# Patient Record
Sex: Female | Born: 1945 | Race: White | Hispanic: Yes | Marital: Married | Smoking: Never smoker
Health system: Southern US, Community
[De-identification: ages and names within clinical notes are randomized; demographics above are authoritative.]

## PROBLEM LIST (undated history)

## (undated) DIAGNOSIS — F32A Depression, unspecified: Secondary | ICD-10-CM

## (undated) DIAGNOSIS — E039 Hypothyroidism, unspecified: Secondary | ICD-10-CM

## (undated) DIAGNOSIS — F329 Major depressive disorder, single episode, unspecified: Secondary | ICD-10-CM

## (undated) DIAGNOSIS — G629 Polyneuropathy, unspecified: Secondary | ICD-10-CM

## (undated) DIAGNOSIS — I10 Essential (primary) hypertension: Secondary | ICD-10-CM

## (undated) HISTORY — PX: BUNIONECTOMY: SHX129

---

## 2009-09-25 DIAGNOSIS — E039 Hypothyroidism, unspecified: Secondary | ICD-10-CM | POA: Insufficient documentation

## 2009-09-25 DIAGNOSIS — I10 Essential (primary) hypertension: Secondary | ICD-10-CM | POA: Insufficient documentation

## 2011-11-18 DIAGNOSIS — F329 Major depressive disorder, single episode, unspecified: Secondary | ICD-10-CM | POA: Insufficient documentation

## 2012-01-01 DIAGNOSIS — M722 Plantar fascial fibromatosis: Secondary | ICD-10-CM | POA: Insufficient documentation

## 2012-01-11 DIAGNOSIS — R350 Frequency of micturition: Secondary | ICD-10-CM | POA: Insufficient documentation

## 2012-01-11 DIAGNOSIS — N816 Rectocele: Secondary | ICD-10-CM | POA: Insufficient documentation

## 2012-01-11 DIAGNOSIS — N8111 Cystocele, midline: Secondary | ICD-10-CM | POA: Insufficient documentation

## 2012-07-19 DIAGNOSIS — N3946 Mixed incontinence: Secondary | ICD-10-CM | POA: Insufficient documentation

## 2012-07-19 DIAGNOSIS — M79609 Pain in unspecified limb: Secondary | ICD-10-CM | POA: Insufficient documentation

## 2012-07-19 DIAGNOSIS — M436 Torticollis: Secondary | ICD-10-CM | POA: Insufficient documentation

## 2012-07-19 DIAGNOSIS — M19019 Primary osteoarthritis, unspecified shoulder: Secondary | ICD-10-CM | POA: Insufficient documentation

## 2013-05-12 ENCOUNTER — Other Ambulatory Visit: Payer: Self-pay | Admitting: Urology

## 2013-06-07 ENCOUNTER — Other Ambulatory Visit: Payer: Self-pay | Admitting: Urology

## 2013-06-27 ENCOUNTER — Other Ambulatory Visit: Payer: Self-pay | Admitting: Obstetrics and Gynecology

## 2013-06-28 DIAGNOSIS — Z5320 Procedure and treatment not carried out because of patient's decision for unspecified reasons: Secondary | ICD-10-CM | POA: Insufficient documentation

## 2013-07-03 ENCOUNTER — Encounter (HOSPITAL_COMMUNITY): Payer: Self-pay | Admitting: Pharmacist

## 2013-07-04 ENCOUNTER — Encounter (HOSPITAL_COMMUNITY)
Admission: RE | Admit: 2013-07-04 | Discharge: 2013-07-04 | Disposition: A | Discharge status: Home / Self Care | Payer: Federal, State, Local not specified - PPO | Source: Ambulatory Visit | Attending: Obstetrics and Gynecology | Admitting: Obstetrics and Gynecology

## 2013-07-04 ENCOUNTER — Encounter (HOSPITAL_COMMUNITY): Payer: Self-pay

## 2013-07-04 DIAGNOSIS — Z01810 Encounter for preprocedural cardiovascular examination: Secondary | ICD-10-CM | POA: Insufficient documentation

## 2013-07-04 DIAGNOSIS — Z01812 Encounter for preprocedural laboratory examination: Secondary | ICD-10-CM | POA: Insufficient documentation

## 2013-07-04 HISTORY — DX: Polyneuropathy, unspecified: G62.9

## 2013-07-04 HISTORY — DX: Essential (primary) hypertension: I10

## 2013-07-04 HISTORY — DX: Hypothyroidism, unspecified: E03.9

## 2013-07-04 HISTORY — DX: Depression, unspecified: F32.A

## 2013-07-04 HISTORY — DX: Major depressive disorder, single episode, unspecified: F32.9

## 2013-07-04 LAB — CBC
HEMATOCRIT: 39.6 % (ref 36.0–46.0)
HEMOGLOBIN: 13.1 g/dL (ref 12.0–15.0)
MCH: 30.5 pg (ref 26.0–34.0)
MCHC: 33.1 g/dL (ref 30.0–36.0)
MCV: 92.1 fL (ref 78.0–100.0)
Platelets: 359 10*3/uL (ref 150–400)
RBC: 4.3 MIL/uL (ref 3.87–5.11)
RDW: 13.5 % (ref 11.5–15.5)
WBC: 6.9 10*3/uL (ref 4.0–10.5)

## 2013-07-04 LAB — BASIC METABOLIC PANEL
BUN: 15 mg/dL (ref 6–23)
CO2: 28 meq/L (ref 19–32)
Calcium: 9.8 mg/dL (ref 8.4–10.5)
Chloride: 97 mEq/L (ref 96–112)
Creatinine, Ser: 0.64 mg/dL (ref 0.50–1.10)
GFR calc Af Amer: >90 mL/min (ref >90)
GFR, EST NON AFRICAN AMERICAN: 90 mL/min — AB (ref >90)
GLUCOSE: 90 mg/dL (ref 70–99)
Potassium: 3.5 mEq/L — ABNORMAL LOW (ref 3.7–5.3)
Sodium: 136 mEq/L — ABNORMAL LOW (ref 137–147)

## 2013-07-04 LAB — PROTIME-INR
INR: 0.96 (ref 0.00–1.49)
PROTHROMBIN TIME: 12.6 s (ref 11.6–15.2)

## 2013-07-04 LAB — APTT: APTT: 32 s (ref 24–37)

## 2013-07-04 NOTE — Patient Instructions (Signed)
[place]  07/04/2013   Your procedure is scheduled on:  07/12/13  Enter through the Main Entrance of [facility] [place] up the phone at the desk and dial [phone].   Call this number if you have problems the morning of surgery: [phone]   Remember:   Do not eat food:After Midnight.  Do not drink clear liquids: After Midnight.  Take these medicines the morning of surgery with A SIP OF WATER: blood pressure medication, may take Prozac, may take thyroid medication   Do not wear jewelry, make-up or nail polish.  Do not wear lotions, powders, or perfumes. You may wear deodorant.  Do not shave 48 hours prior to surgery.  Do not bring valuables to the hospital.  [facility] is not   responsible for any belongings or valuables brought to the hospital.  Contacts, dentures or bridgework may not be worn into surgery.  Leave suitcase in the car. After surgery it may be brought to your room.  For patients admitted to the hospital, checkout time is 11:00 AM the day of              discharge.   Patients discharged the day of surgery will not be allowed to drive             home.  Name and phone number of your driver: NA  Special Instructions:      Please read over the following fact sheets that you were given:   Surgical Site Infection Prevention

## 2013-07-11 MED ORDER — GENTAMICIN SULFATE 40 MG/ML IJ SOLN
5.0000 mg/kg | Freq: Once | INTRAVENOUS | Status: DC
  Filled 2013-07-11: qty 9

## 2013-07-11 MED ORDER — PHENAZOPYRIDINE HCL 200 MG PO TABS
200.0000 mg | ORAL_TABLET | ORAL | Status: AC
  Administered 2013-07-12: 200 mg via ORAL
  Filled 2013-07-11: qty 1

## 2013-07-11 MED ORDER — GENTAMICIN SULFATE 40 MG/ML IJ SOLN
5.0000 mg/kg | Freq: Once | INTRAVENOUS | Status: AC
  Administered 2013-07-12: 359.5 mg via INTRAVENOUS
  Filled 2013-07-11: qty 9

## 2013-07-11 NOTE — H&P (Signed)
History of Present Illness   [name] has vaginal bulging sensation. She has failed I believe 3 trials with a pessary. She does bowel splinting. Sometimes her bowel movements are like pellets and sometimes she will sometime use a laxative. She has not had a hysterectomy. She has urge incontinence and gets up 3 times a night. She was assessed at [facility]   On pelvic examination she had a grade 3 cystocele that exited the introitus associated with a central defect. Cervix descended from 8 or 9 cm to approximately 4-5 cm. She had a distal grade 2 rectocele. She had a negative cough test with hypermobility of the bladder neck. Residual was 80 mL. I drew her a picture last time and I thought if she ever had surgery, she likely benefit from a transvaginal hysterectomy with vault suspension, cystocele repair and graft, and almost for certain a rectocele repair. I recommended a renal ultrasound to rule out silent hydronephrosis and she has never had cystoscopy.   Today she had no blood in the urine nor has she on other 3 occasions.   On January 8, she had a renal ultrasound. Right kidney was 11.37 cm x 5.36 cm x 4.59 cm. Left kidney was 10.77 cm x 5.08 cm x 5.37 cm. There was 18 mL in her bladder. There was no hydronephrosis. Renal cortex looked healthy. She was here to discuss her urodynamics. Review of Systems: No change in bowel or neurologic systems.   On urodynamics, [name] did not void and was catheterized for 40 mL. Maximum capacity was 787 mL. She may have had some low pressure unstable contractions reaching pressures of 5 cmH2O, but she did not leak. She did not leak with a Valsalva pressure of 125 cmH2O with and without the prolapse reduced. During voluntary voiding, she voided 456 mL with a maximum flow of 15 mL/sec. Maximum voiding pressure was 35-43 cmH2O. Residual was 330 mL. Her contraction was not that well sustained and had a little bit of a stepping pattern as noted. EMG activity  increased during the voiding phase. Bladder neck descended 3 cm. She had no reflux. She had a very impressive cystocele fluoroscopically. The details of the urodynamics are signed and dictated on the urodynamic sheet.    Past Medical History Problems  1. History of arthritis (V13.4) 2. History of depression (V11.8) 3. History of hepatitis (V12.09) 4. History of hypertension (V12.59) 5. History of hypothyroidism (V12.29)  Surgical History Problems  1. History of Cesarean Section 2. History of Foot Surgery 3. History of Simple Bunion Exostectomy (Silver Procedure)  Current Meds 1. AmLODIPine Besylate TABS;  Therapy: (Recorded:08Dec2014) to Recorded 2. Bayer Aspirin TABS;  Therapy: (Recorded:08Dec2014) to Recorded 3. Enalapril Maleate TABS;  Therapy: (Recorded:08Dec2014) to Recorded 4. Gabapentin TABS;  Therapy: (Recorded:08Dec2014) to Recorded 5. Levothyroxine Sodium TABS;  Therapy: (Recorded:08Dec2014) to Recorded 6. PROzac TABS;  Therapy: (Recorded:08Dec2014) to Recorded 7. Vyvanse CAPS;  Therapy: (Recorded:08Dec2014) to Recorded  Allergies Medication  1. No Known Drug Allergies  Family History Problems  1. Family history of diabetes mellitus (V18.0) : Grandmother 2. Family history of glaucoma (V19.11) : Sister 3. Family history of retinitis pigmentosa (V19.19) : Grandfather, Aunt, Uncle  Social History Problems  1. Alcohol use   occassional 2. Caffeine use (V49.89)   2 cups of tea (maybe) per day 3. Death in the family, father   age 70 due to heart attack 4. Married 5. Non-smoker (V49.89) 6. Retired 34. Three children   2 sons  and 1 daughter  Results/Data  Urine [Data Includes: Last 1 Day]   [id number]  COLOR YELLOW   APPEARANCE CLEAR   SPECIFIC GRAVITY 1.025   pH 7.5   GLUCOSE NEG mg/dL  BILIRUBIN NEG   KETONE NEG mg/dL  BLOOD NEG   PROTEIN NEG mg/dL  UROBILINOGEN 0.2 mg/dL  NITRITE NEG   LEUKOCYTE ESTERASE NEG    Assessment Assessed  1.  Cystocele, midline (618.01) 2. Urge incontinence of urine (788.31)  Discussion/Summary   I drew [name] a picture. We talked about her urge incontinence and medical and behavioral therapy. The pad she wears each day is more for the bulge than for the incontinence.   I talked to her about watchful waiting versus pessary versus prolapse surgery.  We talked about a transvaginal hysterectomy, vault suspension, cystocele repair and graft and probable rectocele repair.   I drew her a picture and we talked about prolapse surgery in detail. Pros, cons, general surgical and anesthetic risks, and other options including behavioral therapy, pessaries, and watchful waiting were discussed. She understands that prolapse repairs are successful in 80-85% of cases for prolapse symptoms and can recur anteriorly, posteriorly, and/or apically. She understands that in most cases I use a graft and general risks were discussed. Surgical risks were described but not limited to the discussion of injury to neighboring structures including the bowel (with possible life-threatening sepsis and colostomy), bladder, urethra, vagina (all resulting in further surgery), and ureter (resulting in re-implantation). We talked about injury to nerves/soft tissue leading to debilitating and intractable pelvic, abdominal, and lower extremity pain syndromes and neuropathies. The risks of buttock pain, intractable dyspareunia, and vaginal narrowing and shortening with sequelae were discussed. Bleeding risks, transfusion rates, and infection were discussed. The risk of persistent, de novo, or worsening bladder and/or bowel incontinence/dysfunction was discussed. The need for CIC was described as well the usual post-operative course. The patient understands that she might not reach her treatment goal and that she might be worse following surgery.  Mesh issues on TV discussed.   De novo incontinence discussed.   [name] will see Dr.  Garwin Brothers in approximately 2 months when she gets back from looking after her mother. I am going to send a copy of my notes. She is not sexually active. She understands bowel dysfunction is not effected by the surgery, no is urge incontinence.   After a thorough review of the management options for the patient's condition the patient  elected to proceed with surgical therapy as noted above. We have discussed the potential benefits and risks of the procedure, side effects of the proposed treatment, the likelihood of the patient achieving the goals of the procedure, and any potential problems that might occur during the procedure or recuperation. Informed consent has been obtained.

## 2013-07-12 ENCOUNTER — Encounter (HOSPITAL_COMMUNITY)
Admission: RE | Disposition: A | Discharge status: Home / Self Care | Payer: Self-pay | Source: Ambulatory Visit | Attending: Obstetrics and Gynecology

## 2013-07-12 ENCOUNTER — Encounter (HOSPITAL_COMMUNITY): Payer: Self-pay | Admitting: Anesthesiology

## 2013-07-12 ENCOUNTER — Inpatient Hospital Stay (HOSPITAL_COMMUNITY)
Admission: RE | Admit: 2013-07-12 | Discharge: 2013-07-13 | DRG: 743 | Disposition: A | Discharge status: Home / Self Care | Payer: Medicare Other | Source: Ambulatory Visit | Attending: Obstetrics and Gynecology | Admitting: Obstetrics and Gynecology

## 2013-07-12 ENCOUNTER — Encounter (HOSPITAL_COMMUNITY): Payer: Medicare Other | Admitting: Anesthesiology

## 2013-07-12 ENCOUNTER — Ambulatory Visit (HOSPITAL_COMMUNITY): Payer: Medicare Other | Admitting: Anesthesiology

## 2013-07-12 DIAGNOSIS — N3941 Urge incontinence: Secondary | ICD-10-CM | POA: Diagnosis present

## 2013-07-12 DIAGNOSIS — N816 Rectocele: Secondary | ICD-10-CM

## 2013-07-12 DIAGNOSIS — N812 Incomplete uterovaginal prolapse: Principal | ICD-10-CM | POA: Diagnosis present

## 2013-07-12 DIAGNOSIS — R339 Retention of urine, unspecified: Secondary | ICD-10-CM | POA: Diagnosis not present

## 2013-07-12 DIAGNOSIS — IMO0002 Reserved for concepts with insufficient information to code with codable children: Secondary | ICD-10-CM

## 2013-07-12 DIAGNOSIS — F411 Generalized anxiety disorder: Secondary | ICD-10-CM | POA: Diagnosis present

## 2013-07-12 DIAGNOSIS — Z90710 Acquired absence of both cervix and uterus: Secondary | ICD-10-CM | POA: Diagnosis present

## 2013-07-12 DIAGNOSIS — E039 Hypothyroidism, unspecified: Secondary | ICD-10-CM | POA: Diagnosis present

## 2013-07-12 DIAGNOSIS — I10 Essential (primary) hypertension: Secondary | ICD-10-CM | POA: Diagnosis present

## 2013-07-12 DIAGNOSIS — D250 Submucous leiomyoma of uterus: Secondary | ICD-10-CM | POA: Diagnosis present

## 2013-07-12 HISTORY — PX: VAGINAL PROLAPSE REPAIR: SHX830

## 2013-07-12 HISTORY — PX: VAGINAL HYSTERECTOMY: SHX2639

## 2013-07-12 HISTORY — PX: CYSTO: SHX6284

## 2013-07-12 HISTORY — PX: ANTERIOR AND POSTERIOR REPAIR: SHX5121

## 2013-07-12 LAB — TYPE AND SCREEN
ABO/RH(D): O POS
Antibody Screen: NEGATIVE

## 2013-07-12 LAB — ABO/RH: ABO/RH(D): O POS

## 2013-07-12 SURGERY — HYSTERECTOMY, VAGINAL
Anesthesia: General | Site: Vagina

## 2013-07-12 MED ORDER — SODIUM CHLORIDE 0.9 % IR SOLN: Status: DC | PRN

## 2013-07-12 MED ORDER — METHYLENE BLUE 1 % INJ SOLN
INTRAMUSCULAR | Status: AC
  Filled 2013-07-12: qty 10

## 2013-07-12 MED ORDER — MIDAZOLAM HCL 2 MG/2ML IJ SOLN
INTRAMUSCULAR | Status: AC
  Filled 2013-07-12: qty 2

## 2013-07-12 MED ORDER — IBUPROFEN 800 MG PO TABS
800.0000 mg | ORAL_TABLET | Freq: Three times a day (TID) | ORAL | Status: DC | PRN
  Administered 2013-07-13 (×2): 800 mg via ORAL
  Filled 2013-07-12 (×3): qty 1

## 2013-07-12 MED ORDER — NEOSTIGMINE METHYLSULFATE 10 MG/10ML IV SOLN
INTRAVENOUS | Status: AC
  Filled 2013-07-12: qty 1

## 2013-07-12 MED ORDER — LIDOCAINE HCL (CARDIAC) 20 MG/ML IV SOLN
INTRAVENOUS | Status: DC | PRN
  Administered 2013-07-12: 60 mg via INTRAVENOUS

## 2013-07-12 MED ORDER — GLYCOPYRROLATE 0.2 MG/ML IJ SOLN
INTRAMUSCULAR | Status: AC
  Filled 2013-07-12: qty 6

## 2013-07-12 MED ORDER — GABAPENTIN 300 MG PO CAPS
300.0000 mg | ORAL_CAPSULE | Freq: Every day | ORAL | Status: DC
  Administered 2013-07-12: 300 mg via ORAL
  Filled 2013-07-12 (×2): qty 1

## 2013-07-12 MED ORDER — OXYCODONE-ACETAMINOPHEN 5-325 MG PO TABS
1.0000 | ORAL_TABLET | ORAL | Status: DC | PRN
  Administered 2013-07-13: 1 via ORAL
  Filled 2013-07-12: qty 1

## 2013-07-12 MED ORDER — ESTRADIOL 0.1 MG/GM VA CREA
TOPICAL_CREAM | VAGINAL | Status: DC | PRN
  Administered 2013-07-12: 1 via VAGINAL

## 2013-07-12 MED ORDER — FENTANYL CITRATE 0.05 MG/ML IJ SOLN
INTRAMUSCULAR | Status: DC | PRN
  Administered 2013-07-12 (×5): 50 ug via INTRAVENOUS
  Administered 2013-07-12: 100 ug via INTRAVENOUS

## 2013-07-12 MED ORDER — PANTOPRAZOLE SODIUM 40 MG PO TBEC
40.0000 mg | DELAYED_RELEASE_TABLET | Freq: Every day | ORAL | Status: DC
  Administered 2013-07-12 – 2013-07-13 (×2): 40 mg via ORAL
  Filled 2013-07-12 (×3): qty 1

## 2013-07-12 MED ORDER — PROPOFOL INFUSION 10 MG/ML OPTIME
INTRAVENOUS | Status: DC | PRN
  Administered 2013-07-12: 150 mL via INTRAVENOUS

## 2013-07-12 MED ORDER — ENALAPRIL MALEATE 5 MG PO TABS
5.0000 mg | ORAL_TABLET | Freq: Every day | ORAL | Status: DC
  Administered 2013-07-13: 5 mg via ORAL
  Filled 2013-07-12 (×3): qty 1

## 2013-07-12 MED ORDER — STERILE WATER FOR IRRIGATION IR SOLN
Status: DC | PRN
  Administered 2013-07-12: 1000 mL via INTRAVESICAL

## 2013-07-12 MED ORDER — HYDROMORPHONE HCL PF 1 MG/ML IJ SOLN
INTRAMUSCULAR | Status: AC
  Filled 2013-07-12: qty 1

## 2013-07-12 MED ORDER — ROCURONIUM BROMIDE 100 MG/10ML IV SOLN
INTRAVENOUS | Status: DC | PRN
  Administered 2013-07-12 (×3): 10 mg via INTRAVENOUS
  Administered 2013-07-12: 40 mg via INTRAVENOUS
  Administered 2013-07-12: 10 mg via INTRAVENOUS

## 2013-07-12 MED ORDER — NEOSTIGMINE METHYLSULFATE 10 MG/10ML IV SOLN
INTRAVENOUS | Status: DC | PRN
  Administered 2013-07-12: 4 mg via INTRAVENOUS

## 2013-07-12 MED ORDER — HYDROMORPHONE HCL PF 1 MG/ML IJ SOLN: 0.2000 mg | INTRAMUSCULAR | Status: DC | PRN

## 2013-07-12 MED ORDER — DEXAMETHASONE SODIUM PHOSPHATE 4 MG/ML IJ SOLN
INTRAMUSCULAR | Status: DC | PRN
  Administered 2013-07-12: 10 mg via INTRAVENOUS

## 2013-07-12 MED ORDER — AMLODIPINE BESYLATE 10 MG PO TABS
10.0000 mg | ORAL_TABLET | Freq: Every day | ORAL | Status: DC
  Administered 2013-07-13: 10 mg via ORAL
  Filled 2013-07-12 (×3): qty 1

## 2013-07-12 MED ORDER — GLYCOPYRROLATE 0.2 MG/ML IJ SOLN
INTRAMUSCULAR | Status: DC | PRN
  Administered 2013-07-12: .8 mg via INTRAVENOUS
  Administered 2013-07-12: 0.2 mg via INTRAVENOUS

## 2013-07-12 MED ORDER — FENTANYL CITRATE 0.05 MG/ML IJ SOLN
INTRAMUSCULAR | Status: AC
  Filled 2013-07-12: qty 2

## 2013-07-12 MED ORDER — KETOROLAC TROMETHAMINE 30 MG/ML IJ SOLN
30.0000 mg | Freq: Four times a day (QID) | INTRAMUSCULAR | Status: DC
  Administered 2013-07-12: 30 mg via INTRAVENOUS
  Filled 2013-07-12: qty 1

## 2013-07-12 MED ORDER — MENTHOL 3 MG MT LOZG
1.0000 | LOZENGE | OROMUCOSAL | Status: DC | PRN
  Administered 2013-07-12: 3 mg via ORAL
  Filled 2013-07-12: qty 9

## 2013-07-12 MED ORDER — DEXTROSE IN LACTATED RINGERS 5 % IV SOLN
INTRAVENOUS | Status: DC
  Administered 2013-07-12 – 2013-07-13 (×3): via INTRAVENOUS

## 2013-07-12 MED ORDER — MEPERIDINE HCL 25 MG/ML IJ SOLN
6.2500 mg | INTRAMUSCULAR | Status: DC | PRN
Start: 2013-07-12 — End: 2013-07-12

## 2013-07-12 MED ORDER — ROCURONIUM BROMIDE 100 MG/10ML IV SOLN
INTRAVENOUS | Status: AC
  Filled 2013-07-12: qty 1

## 2013-07-12 MED ORDER — LIDOCAINE HCL (CARDIAC) 20 MG/ML IV SOLN
INTRAVENOUS | Status: AC
  Filled 2013-07-12: qty 5

## 2013-07-12 MED ORDER — MIDAZOLAM HCL 5 MG/5ML IJ SOLN
INTRAMUSCULAR | Status: DC | PRN
  Administered 2013-07-12 (×2): 1 mg via INTRAVENOUS

## 2013-07-12 MED ORDER — FLUOXETINE HCL 20 MG PO CAPS
80.0000 mg | ORAL_CAPSULE | Freq: Every day | ORAL | Status: DC
  Administered 2013-07-13: 80 mg via ORAL
  Filled 2013-07-12 (×3): qty 4

## 2013-07-12 MED ORDER — ONDANSETRON HCL 4 MG PO TABS: 4.0000 mg | ORAL_TABLET | Freq: Four times a day (QID) | ORAL | Status: DC | PRN

## 2013-07-12 MED ORDER — ESTRADIOL 0.1 MG/GM VA CREA
TOPICAL_CREAM | VAGINAL | Status: AC
  Filled 2013-07-12: qty 42.5

## 2013-07-12 MED ORDER — PROPOFOL 10 MG/ML IV EMUL
INTRAVENOUS | Status: AC
  Filled 2013-07-12: qty 20

## 2013-07-12 MED ORDER — 0.9 % SODIUM CHLORIDE (POUR BTL) OPTIME
TOPICAL | Status: DC | PRN
  Administered 2013-07-12: 1000 mL

## 2013-07-12 MED ORDER — CEFAZOLIN SODIUM-DEXTROSE 2-3 GM-% IV SOLR
2.0000 g | INTRAVENOUS | Status: AC
  Administered 2013-07-12: 2 g via INTRAVENOUS

## 2013-07-12 MED ORDER — CEFAZOLIN SODIUM-DEXTROSE 2-3 GM-% IV SOLR
INTRAVENOUS | Status: AC
  Filled 2013-07-12: qty 50

## 2013-07-12 MED ORDER — HYDROMORPHONE HCL PF 1 MG/ML IJ SOLN
INTRAMUSCULAR | Status: DC | PRN
  Administered 2013-07-12: 1 mg via INTRAVENOUS

## 2013-07-12 MED ORDER — MIDAZOLAM HCL 2 MG/2ML IJ SOLN: 0.5000 mg | Freq: Once | INTRAMUSCULAR | Status: DC | PRN

## 2013-07-12 MED ORDER — TRIAMTERENE-HCTZ 37.5-25 MG PO CAPS
1.0000 | ORAL_CAPSULE | Freq: Every day | ORAL | Status: DC
  Administered 2013-07-13: 1 via ORAL
  Filled 2013-07-12 (×3): qty 1

## 2013-07-12 MED ORDER — BUPROPION HCL ER (XL) 150 MG PO TB24
150.0000 mg | ORAL_TABLET | Freq: Every day | ORAL | Status: DC
  Administered 2013-07-13: 150 mg via ORAL
  Filled 2013-07-12 (×3): qty 1

## 2013-07-12 MED ORDER — LACTATED RINGERS IV SOLN
INTRAVENOUS | Status: DC
Start: 2013-07-12 — End: 2013-07-12
  Administered 2013-07-12 (×2): via INTRAVENOUS

## 2013-07-12 MED ORDER — LEVOTHYROXINE SODIUM 137 MCG PO TABS
137.0000 ug | ORAL_TABLET | Freq: Every day | ORAL | Status: DC
  Administered 2013-07-13: 137 ug via ORAL
  Filled 2013-07-12 (×2): qty 1

## 2013-07-12 MED ORDER — FENTANYL CITRATE 0.05 MG/ML IJ SOLN
INTRAMUSCULAR | Status: AC
  Filled 2013-07-12: qty 5

## 2013-07-12 MED ORDER — DEXAMETHASONE SODIUM PHOSPHATE 10 MG/ML IJ SOLN
INTRAMUSCULAR | Status: AC
  Filled 2013-07-12: qty 1

## 2013-07-12 MED ORDER — KETOROLAC TROMETHAMINE 30 MG/ML IJ SOLN
15.0000 mg | Freq: Once | INTRAMUSCULAR | Status: DC | PRN
Start: 2013-07-12 — End: 2013-07-12

## 2013-07-12 MED ORDER — ONDANSETRON HCL 4 MG/2ML IJ SOLN: 4.0000 mg | Freq: Four times a day (QID) | INTRAMUSCULAR | Status: DC | PRN

## 2013-07-12 MED ORDER — PROMETHAZINE HCL 25 MG/ML IJ SOLN
6.2500 mg | INTRAMUSCULAR | Status: DC | PRN
Start: 2013-07-12 — End: 2013-07-12

## 2013-07-12 MED ORDER — KETOROLAC TROMETHAMINE 30 MG/ML IJ SOLN: 30.0000 mg | Freq: Four times a day (QID) | INTRAMUSCULAR | Status: DC

## 2013-07-12 MED ORDER — METHYLPHENIDATE HCL ER 10 MG PO TBCR: 27.0000 mg | EXTENDED_RELEASE_TABLET | ORAL | Status: DC

## 2013-07-12 MED ORDER — LIDOCAINE-EPINEPHRINE (PF) 1 %-1:200000 IJ SOLN
INTRAMUSCULAR | Status: DC | PRN
Start: 2013-07-12 — End: 2013-07-12
  Administered 2013-07-12: 50 mL

## 2013-07-12 MED ORDER — SODIUM CHLORIDE 0.9 % IR SOLN
Freq: Once | Status: AC
  Administered 2013-07-12: 09:00:00
  Filled 2013-07-12: qty 1

## 2013-07-12 MED ORDER — FLUOXETINE HCL 40 MG PO CAPS: 80.0000 mg | ORAL_CAPSULE | Freq: Every day | ORAL | Status: DC

## 2013-07-12 MED ORDER — ONDANSETRON HCL 4 MG/2ML IJ SOLN
INTRAMUSCULAR | Status: AC
  Filled 2013-07-12: qty 2

## 2013-07-12 MED ORDER — FENTANYL CITRATE 0.05 MG/ML IJ SOLN: 25.0000 ug | INTRAMUSCULAR | Status: DC | PRN

## 2013-07-12 SURGICAL SUPPLY — 62 items
BLADE 15 SAFETY STRL DISP (BLADE) ×4 IMPLANT
CANISTER SUCT 3000ML (MISCELLANEOUS) ×8 IMPLANT
CATH FOLEY 2WAY SLVR  5CC 16FR (CATHETERS)
CATH FOLEY 2WAY SLVR 5CC 16FR (CATHETERS) IMPLANT
CATH ROBINSON RED A/P 16FR (CATHETERS) IMPLANT
CLOTH BEACON ORANGE TIMEOUT ST (SAFETY) ×4 IMPLANT
CONT PATH 16OZ SNAP LID 3702 (MISCELLANEOUS) IMPLANT
CONTAINER PREFILL 10% NBF 60ML (FORM) ×4 IMPLANT
DECANTER SPIKE VIAL GLASS SM (MISCELLANEOUS) ×8 IMPLANT
DERMABOND ADVANCED (GAUZE/BANDAGES/DRESSINGS)
DERMABOND ADVANCED .7 DNX12 (GAUZE/BANDAGES/DRESSINGS) IMPLANT
DEVICE CAPIO SLIM SINGLE (INSTRUMENTS) IMPLANT
DRAIN PENROSE 1/4X12 LTX (DRAIN) ×4 IMPLANT
DRAPE HYSTEROSCOPY (DRAPE) ×4 IMPLANT
DRAPE STERI URO 9X17 APER PCH (DRAPES) ×4 IMPLANT
DRAPE UNDERBUTTOCKS STRL (DRAPE) ×4 IMPLANT
ELECT LIGASURE SHORT 9 REUSE (ELECTRODE) ×4 IMPLANT
GAUZE PACKING 2X5 YD STRL (GAUZE/BANDAGES/DRESSINGS) IMPLANT
GAUZE PACKING IODOFORM 2 (PACKING) ×4 IMPLANT
GAUZE SPONGE 4X4 16PLY XRAY LF (GAUZE/BANDAGES/DRESSINGS) ×8 IMPLANT
GLOVE BIO SURGEON STRL SZ7.5 (GLOVE) ×4 IMPLANT
GLOVE BIO SURGEON STRL SZ8 (GLOVE) ×8 IMPLANT
GLOVE BIOGEL PI IND STRL 6.5 (GLOVE) ×2 IMPLANT
GLOVE BIOGEL PI IND STRL 7.0 (GLOVE) ×2 IMPLANT
GLOVE BIOGEL PI INDICATOR 6.5 (GLOVE) ×2
GLOVE BIOGEL PI INDICATOR 7.0 (GLOVE) ×2
GLOVE ECLIPSE 6.5 STRL STRAW (GLOVE) ×4 IMPLANT
GOWN STRL REUS W/TWL LRG LVL3 (GOWN DISPOSABLE) ×32 IMPLANT
NEEDLE HYPO 22GX1.5 SAFETY (NEEDLE) ×4 IMPLANT
NEEDLE MAYO 6 CRC TAPER PT (NEEDLE) IMPLANT
NEEDLE SPNL 22GX3.5 QUINCKE BK (NEEDLE) IMPLANT
NS IRRIG 1000ML POUR BTL (IV SOLUTION) ×8 IMPLANT
PACK VAGINAL WOMENS (CUSTOM PROCEDURE TRAY) ×4 IMPLANT
PAD OB MATERNITY 4.3X12.25 (PERSONAL CARE ITEMS) ×4 IMPLANT
PENCIL BUTTON HOLSTER BLD 10FT (ELECTRODE) ×4 IMPLANT
PLUG CATH AND CAP STER (CATHETERS) ×4 IMPLANT
RETRACTOR STAY HOOK 5MM (MISCELLANEOUS) ×4 IMPLANT
SET CYSTO W/LG BORE CLAMP LF (SET/KITS/TRAYS/PACK) ×4 IMPLANT
SHEET LAVH (DRAPES) ×8 IMPLANT
SUT CAPIO ETHIBPND (SUTURE) IMPLANT
SUT SILK 2 0 FS (SUTURE) IMPLANT
SUT VIC AB 0 CT1 18XCR BRD8 (SUTURE) ×2 IMPLANT
SUT VIC AB 0 CT1 27 (SUTURE) ×6
SUT VIC AB 0 CT1 27XBRD ANBCTR (SUTURE) ×4 IMPLANT
SUT VIC AB 0 CT1 27XCR 8 STRN (SUTURE) ×2 IMPLANT
SUT VIC AB 0 CT1 8-18 (SUTURE) ×2
SUT VIC AB 0 CT2 27 (SUTURE) IMPLANT
SUT VIC AB 2-0 CT1 (SUTURE) ×8 IMPLANT
SUT VIC AB 2-0 SH 27 (SUTURE) ×12
SUT VIC AB 2-0 SH 27XBRD (SUTURE) ×12 IMPLANT
SUT VIC AB 3-0 CT1 27 (SUTURE) ×2
SUT VIC AB 3-0 CT1 TAPERPNT 27 (SUTURE) ×2 IMPLANT
SUT VIC AB 3-0 PS2 18 (SUTURE)
SUT VIC AB 3-0 PS2 18XBRD (SUTURE) IMPLANT
SUT VICRYL 0 TIES 12 18 (SUTURE) ×4 IMPLANT
SUT VICRYL 0 UR6 27IN ABS (SUTURE) ×8 IMPLANT
TISSUE REPAIR XENFORM 6X10CM (Tissue) ×4 IMPLANT
TOWEL OR 17X24 6PK STRL BLUE (TOWEL DISPOSABLE) ×16 IMPLANT
TRAY FOLEY CATH 14FR (SET/KITS/TRAYS/PACK) ×8 IMPLANT
TUBING NON-CON 1/4 X 20 CONN (TUBING) ×3 IMPLANT
TUBING NON-CON 1/4 X 20' CONN (TUBING) ×1
WATER STERILE IRR 1000ML POUR (IV SOLUTION) ×8 IMPLANT

## 2013-07-12 NOTE — Anesthesia Postprocedure Evaluation (Signed)
  Anesthesia Post Note  Patient: Alexis Lang  Procedure(s) Performed: Procedure(s) (LRB): HYSTERECTOMY VAGINAL  (N/A) ANTERIOR (CYSTOCELE) AND POSTERIOR REPAIR (RECTOCELE) (N/A) CYSTO (N/A) VAGINAL VAULT SUSPENSION WITH GRAFT (N/A)  Anesthesia type: GA  Patient location: PACU  Post pain: Pain level controlled  Post assessment: Post-op Vital signs reviewed  Last Vitals:  Filed Vitals:   07/12/13 1215  BP: 102/45  Pulse: 66  Temp:   Resp: 22    Post vital signs: Reviewed  Level of consciousness: sedated  Complications: No apparent anesthesia complications

## 2013-07-12 NOTE — Transfer of Care (Signed)
Immediate Anesthesia Transfer of Care Note  Patient: Alexis Lang  Procedure(s) Performed: Procedure(s): HYSTERECTOMY VAGINAL  (N/A) ANTERIOR (CYSTOCELE) AND POSTERIOR REPAIR (RECTOCELE) (N/A) CYSTO (N/A) VAGINAL VAULT SUSPENSION WITH GRAFT (N/A)  Patient Location: PACU  Anesthesia Type:General  Level of Consciousness: awake and sedated  Airway & Oxygen Therapy: Patient Spontanous Breathing and Patient connected to nasal cannula oxygen  Post-op Assessment: Report given to PACU RN and Post -op Vital signs reviewed and stable  Post vital signs: Reviewed and stable  Complications: No apparent anesthesia complications

## 2013-07-12 NOTE — Op Note (Signed)
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 Nicki Reaper Jad Johansson Asst. Dr. Cheron Schaumann  The patient has the above diagnoses and consented to the above procedure. Extra care was taken with leg positioning to minimize the risk of compartment syndrome and neuropathy and deep vein thrombosis. I assisted Dr. Garwin Brothers who performed a transvaginal hysterectomy. She ran the posterior cuff. Ureteral sacral ligaments were not strong. Cuff was left open at the beginning of my surgery  I instilled 20 cc of a lidocaine epinephrine mixture for her grade 3 cystocele with central defect. Utilizing multiple Allis clamps I made a long anterior vaginal wall incision to the proximal urethra or bladder neck. I sharply and bluntly dissected the overlying vaginal wall mucosa from the underlying pubocervical fascia to the white line bilaterally. I mobilized appropriate at the vault.  I did an anterior repair with 2-0 Vicryl 2 layer repair. I was careful to keep good anterior length on the bladder. I was pleased with the anterior repair and I did not imbricate the urethra.   The patient underwent cystoscopy. Cystoscopically she had a good repair. She was no distortion of the ureters. She had excellent yellow jets bilaterally  I prepared a 10 x 6 dermal graft the shape of a trapezoid. The graft was soaked as per protocol  I finger dissected to the ischial spine bilaterally. She had very large spines. All soft tissue was swept medially. She had a good sacrospinous ligament bilaterally. I placed a 0 Ethibond with a Capio device 1 full finger breath medial to the each spine in a straight line between the spines. I triple checked the position and is very pleased with it. I did a thorough rectal examination there was no injury to  rectum or suture in the rectum  I Utilized my usual technique and placed a 2-0 Vicryl suture on a UR 6 needle into the pelvic sidewall at the level of the urethrovesical angle. I sewed the graft in tension-free between the 4 sutures.  I trimmed an appropriate amount of anterior vaginal wall mucosa and closed the anterior vaginal wall with running 2-0 Vicryl on a CT1 needle  Dr. cousins closed the cuff and did a culdoplasty.  I then did a rectal examination and she diffuse weakness with very good length posteriorly.  I placed an Allis clamp on the hymenal ring at the level the posterior fourchette and removed a small triangle of perineal skin. I instilled 20 cc of a lidocaine epinephrine mixture. I made an appropriate long posterior vaginal wall incision all the way close to the cuff. I sharply and bluntly dissected the overlying vaginal wall mucosa from the underlying rectovaginal fascia. I mobilized to the sidewall appropriately. I repeated the rectal examination and she had diffuse weakness with no obvious site defect  I did a 2 layer posterior repair with running 2-0 Vicryl suture and was happy at the cuff how I did the repair. Repeat rectal examination was excellent with no injury.  I trimmed a few millimeters of posterior vaginal wall mucosa from and closed the posterior vaginal wall with running 2-0 Vicryl on a CT1 needle. The suture was exteriorized closing the perineum subcuticularly. Prior to this I did 1 gentle 0 Vicryl perineal body closure.  The patient had excellent length anterior and posteriorly. Cuff was well supported. Blood loss was less than 100 mL. Vaginal  pack with Estrace cream was utilized. Leg position was excellent. Urine was clear with reasonable output throughout the case  Hopefully the operation will reach the patient's treatment goal

## 2013-07-12 NOTE — Progress Notes (Signed)
Looks Banker normal Spoke with pt See in am No nerve pain

## 2013-07-12 NOTE — Brief Op Note (Signed)
07/12/2013  11:10 AM  PATIENT:  Alexis Lang  68 y.o. 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  POST-OPERATIVE DIAGNOSIS:  Uterovaginal prolapse,cystocele,rectocele  PROCEDURE:  Total vaginal hysterectomy, mcCall culdoplasty  SURGEON:  Surgeon(s) and Role: Panel 1:    * Amalee Olsen Clint Bolder, MD - Primary  Panel 2:    * Reece Packer, MD - Primary  PHYSICIAN ASSISTANT:   ASSISTANTS: Dr Nicki Reaper macDiarmid   ANESTHESIA:   general  EBL:  Total I/O In: 2000 [I.V.:2000] Out: 300 [Urine:200; Blood:100]  BLOOD ADMINISTERED:none  DRAINS: none   LOCAL MEDICATIONS USED:  LIDOCAINE   SPECIMEN:  Source of Specimen:  uterus w/ cervix  DISPOSITION OF SPECIMEN:  PATHOLOGY  COUNTS:  YES  TOURNIQUET:  * No tourniquets in log *  DICTATION: .Other Dictation: Dictation Number [id number]  PLAN OF CARE: Admit to inpatient   PATIENT DISPOSITION:  PACU - hemodynamically stable.   Delay start of Pharmacological VTE agent (>24hrs) due to surgical blood loss or risk of bleeding: no

## 2013-07-12 NOTE — Anesthesia Preprocedure Evaluation (Signed)
Anesthesia Evaluation  Patient identified by MRN, date of birth, ID band Patient awake    Reviewed: Allergy & Precautions, H&P , Patient's Chart, lab work & pertinent test results, reviewed documented beta blocker date and time   History of Anesthesia Complications Negative for: history of anesthetic complications  Airway Mallampati: II TM Distance: >3 FB Neck ROM: full    Dental   Pulmonary  breath sounds clear to auscultation        Cardiovascular Exercise Tolerance: Good hypertension, Rhythm:regular Rate:Normal     Neuro/Psych    GI/Hepatic   Endo/Other  Hypothyroidism   Renal/GU      Musculoskeletal   Abdominal   Peds  Hematology   Anesthesia Other Findings   Reproductive/Obstetrics                           Anesthesia Physical Anesthesia Plan  ASA: II  Anesthesia Plan: General ETT   Post-op Pain Management:    Induction:   Airway Management Planned:   Additional Equipment:   Intra-op Plan:   Post-operative Plan:   Informed Consent: I have reviewed the patients History and Physical, chart, labs and discussed the procedure including the risks, benefits and alternatives for the proposed anesthesia with the patient or authorized representative who has indicated his/her understanding and acceptance.   Dental Advisory Given  Plan Discussed with: CRNA and Surgeon  Anesthesia Plan Comments:         Anesthesia Quick Evaluation

## 2013-07-13 ENCOUNTER — Encounter (HOSPITAL_COMMUNITY): Payer: Self-pay | Admitting: Obstetrics and Gynecology

## 2013-07-13 LAB — BASIC METABOLIC PANEL
BUN: 11 mg/dL (ref 6–23)
CHLORIDE: 102 meq/L (ref 96–112)
CO2: 28 mEq/L (ref 19–32)
Calcium: 8.5 mg/dL (ref 8.4–10.5)
Creatinine, Ser: 0.54 mg/dL (ref 0.50–1.10)
GFR calc non Af Amer: >90 mL/min (ref >90)
Glucose, Bld: 136 mg/dL — ABNORMAL HIGH (ref 70–99)
POTASSIUM: 4.1 meq/L (ref 3.7–5.3)
Sodium: 139 mEq/L (ref 137–147)

## 2013-07-13 LAB — CBC
HEMATOCRIT: 32 % — AB (ref 36.0–46.0)
Hemoglobin: 10.5 g/dL — ABNORMAL LOW (ref 12.0–15.0)
MCH: 30.5 pg (ref 26.0–34.0)
MCHC: 32.8 g/dL (ref 30.0–36.0)
MCV: 93 fL (ref 78.0–100.0)
Platelets: 288 10*3/uL (ref 150–400)
RBC: 3.44 MIL/uL — ABNORMAL LOW (ref 3.87–5.11)
RDW: 13.7 % (ref 11.5–15.5)
WBC: 10.3 10*3/uL (ref 4.0–10.5)

## 2013-07-13 MED ORDER — OXYCODONE-ACETAMINOPHEN 5-325 MG PO TABS: 1.0000 | ORAL_TABLET | ORAL | Status: DC | PRN

## 2013-07-13 MED ORDER — IBUPROFEN 800 MG PO TABS: 800.0000 mg | ORAL_TABLET | Freq: Three times a day (TID) | ORAL | Status: AC | PRN

## 2013-07-13 NOTE — Addendum Note (Signed)
Addendum created 07/13/13 0746 by Talbot Grumbling, CRNA   Modules edited: Notes Section   Notes Section:  File: [id number]

## 2013-07-13 NOTE — Progress Notes (Signed)
Looks good Labs normal Vitals normal Discussed post op course  Void trial

## 2013-07-13 NOTE — Discharge Summary (Signed)
Physician Discharge Summary  Patient ID: Alexis Lang MRN: [id number] DOB/AGE: [birth year] 68 y.o.  Admit date: 07/12/2013 Discharge date: 07/13/2013  Admission Diagnoses: uterovaginal prolapse, cystocele, rectocele, vault prolapse  Discharge Diagnoses: uterovaginal prolapse, cystocele, rectocele, vault prolapse, urinary retention Active Problems:   S/P vaginal hysterectomy rectocele and cystocele repair, vault suspension with graft, cystoscopy Hypertension Anxiety disorder hypothyroidism  Discharged Condition: stable  Hospital Course: Pt was admitted to [facility] where she underwent TVH, cystoscopy, rectocele and cystocele repair, vault suspension with graft. See operative reports. Postoperative course notable for urinary retention requiring discharge with leg bag  Consults: None  Significant Diagnostic Studies: labs:  CBC Latest Ref Rng 07/13/2013 07/04/2013  WBC 4.0 - 10.5 K/uL 10.3 6.9  Hemoglobin 12.0 - 15.0 g/dL 10.5(L) 13.1  Hematocrit 36.0 - 46.0 % 32.0(L) 39.6  Platelets 150 - 400 K/uL 288 359      Treatments: surgery: TVH, cystoscopy, rectocele and cystocele repair, vault suspension with graft  Discharge Exam: Blood pressure 106/52, pulse 56, temperature 97.5 F (36.4 C), temperature source Oral, resp. rate 18, height 5\' 6"  (1.676 m), weight 90.719 kg (200 lb), SpO2 98.00%. General appearance: alert, cooperative and no distress Breasts: normal appearance, no masses or tenderness, Normal to palpation without dominant masses Cardio: regular rate and rhythm, S1, S2 normal, no murmur, click, rub or gallop Pelvic: deferred Extremities: no edema, redness or tenderness in the calves or thighs Skin: Skin color, texture, turgor normal. No rashes or lesions Pad scant brown blood  Disposition: Final discharge disposition not confirmed  Discharge Instructions   Diet - low sodium heart healthy    Complete by:  As directed      Discharge instructions    Complete by:  As directed    Call if temperature greater than equal to 100.4, nothing per vagina for 4-6 weeks or severe nausea vomiting, increased incisional pain , drainage or redness in the incision site, no straining with bowel movements, showers no bath     Discharge patient    Complete by:  As directed   Pending voiding trial instructions from dr Matilde Sprang     Increase activity slowly    Complete by:  As directed      No wound care    Complete by:  As directed             Medication List    STOP taking these medications       aspirin EC 325 MG tablet      TAKE these medications       amLODipine 10 MG tablet  Commonly known as:  NORVASC  Take 10 mg by mouth daily.     ARNICARE ARNICA Crea  Apply 1 application topically daily as needed (rhematic pain).     buPROPion 150 MG 24 hr tablet  Commonly known as:  WELLBUTRIN XL  Take 150 mg by mouth daily.     CALADRYL CLEAR 1-0.1 % Lotn  Generic drug:  pramoxine-zinc acetate  Apply 1 application topically as needed.     enalapril 5 MG tablet  Commonly known as:  VASOTEC  Take 5 mg by mouth daily.     FLUoxetine 40 MG capsule  Commonly known as:  PROZAC  Take 80 mg by mouth daily.     gabapentin 300 MG capsule  Commonly known as:  NEURONTIN  Take 300 mg by mouth at bedtime.     ibuprofen 800 MG tablet  Commonly known as:  ADVIL,MOTRIN  Take 1 tablet (800  mg total) by mouth every 8 (eight) hours as needed (mild pain).     levothyroxine 137 MCG tablet  Commonly known as:  SYNTHROID, LEVOTHROID  Take 137 mcg by mouth daily before breakfast.     loperamide 2 MG capsule  Commonly known as:  IMODIUM  Take 2-4 mg by mouth as needed for diarrhea or loose stools.     loratadine 10 MG tablet  Commonly known as:  CLARITIN  Take 10 mg by mouth daily as needed for allergies.     methylphenidate 27 MG CR tablet  Commonly known as:  CONCERTA  Take 27 mg by mouth every morning. Prescribed by Dr. Toy Care     omega-3 acid ethyl esters 1 G capsule   Commonly known as:  LOVAZA  Take 1 g by mouth 2 (two) times daily. Melaleuca Brand     oxyCODONE-acetaminophen 5-325 MG per tablet  Commonly known as:  PERCOCET/ROXICET  Take 1-2 tablets by mouth every 4 (four) hours as needed for severe pain (moderate to severe pain (when tolerating fluids)).     triamterene-hydrochlorothiazide 37.5-25 MG per capsule  Commonly known as:  DYAZIDE  Take 1 capsule by mouth daily.           Follow-up Information   Follow up with Narcissus Detwiler A, MD On 08/08/2013.   Specialty:  Obstetrics and Gynecology   Contact information:   [address] [place] [address] [phone]       Signed: Alanda Slim A Abhijot Straughter 07/13/2013, 8:14 AM

## 2013-07-13 NOTE — Progress Notes (Signed)
Pt. Is discharged in the care of [place] R.N. Escort. Denies any pain or discomfort. Discharged instructions with Rx were given to pt with good understanding Pt   Discharge with #14 foley catheter as ordered by Dr,. Pt residuals were very high. Leg bag was also sent home with pt. Instructions were given on foley care, with good understanding.

## 2013-07-13 NOTE — Anesthesia Postprocedure Evaluation (Signed)
Anesthesia Post Note  Patient: Alexis Lang  Procedure(s) Performed: Procedure(s) (LRB): HYSTERECTOMY VAGINAL  (N/A) ANTERIOR (CYSTOCELE) AND POSTERIOR REPAIR (RECTOCELE) (N/A) CYSTO (N/A) VAGINAL VAULT SUSPENSION WITH GRAFT (N/A)  Anesthesia type: General  Patient location: Mother/Baby  Post pain: Pain level controlled  Post assessment: Post-op Vital signs reviewed  Last Vitals:  Filed Vitals:   07/13/13 0538  BP: 106/52  Pulse: 56  Temp: 36.4 C  Resp:     Post vital signs: Reviewed  Level of consciousness: awake and alert   Complications: No apparent anesthesia complications

## 2013-07-13 NOTE — Progress Notes (Signed)
Subjective: Patient reports incisional pain.  Pain well controlled with med. No nausea. Eating breakfast Notes some low back pain  Objective: I have reviewed patient's vital signs.  vital signs, intake and output, medications and labs. Filed Vitals:   07/13/13 0538  BP: 106/52  Pulse: 56  Temp: 97.5 F (36.4 C)  Resp:    I/O last 3 completed shifts: In: 3050 [P.O.:850; I.V.:2200] Out: 2775 [Urine:2675; Blood:100]    Lab Results  Component Value Date   WBC 10.3 07/13/2013   HGB 10.5* 07/13/2013   HCT 32.0* 07/13/2013   MCV 93.0 07/13/2013   PLT 288 07/13/2013   Lab Results  Component Value Date   CREATININE 0.54 07/13/2013    EXAM General: alert, cooperative and no distress Resp: clear to auscultation bilaterally Cardio: regular rate and rhythm, S1, S2 normal, no murmur, click, rub or gallop GI: soft, non-tender; bowel sounds normal; no masses,  no organomegaly Extremities: no edema, redness or tenderness in the calves or thighs Vaginal Bleeding: minimal  Assessment: s/p Procedure(s): HYSTERECTOMY VAGINAL  ANTERIOR (CYSTOCELE) AND POSTERIOR REPAIR (RECTOCELE) CYSTO VAGINAL VAULT SUSPENSION WITH GRAFT: stable, progressing well and tolerating diet  Plan: Advance diet Encourage ambulation Advance to PO medication Discontinue IV fluids Discharge home pending voiding trial D/c instructions reviewed.  F/u 6/23  LOS: 1 day    Marvene Staff, MD 07/13/2013 8:06 AM    07/13/2013, 8:06 AM

## 2013-07-13 NOTE — Op Note (Signed)
NAME:  Alexis Lang, Alexis Lang               ACCOUNT NO.:  [id number]  MEDICAL RECORD NO.:  [id number]  LOCATION:  6283                          FACILITY:  [facility]  PHYSICIAN:  Servando Salina, M.D.DATE OF BIRTH:  [birth year]  DATE OF PROCEDURE:  07/12/2013 DATE OF DISCHARGE:                              OPERATIVE REPORT   PREOPERATIVE DIAGNOSIS:  Second-degree uterovaginal prolapse, cystocele, rectocele, vault prolapse.  POSTOPERATIVE DIAGNOSIS:  Second-degree uterovaginal prolapse, cystocele, rectocele.  PROCEDURE:  Total vaginal hysterectomy, McCall culdoplasty.  SURGEON:  Servando Salina, M.D.  ASSISTANT:  Dr. Bjorn Loser.  ESTIMATED BLOOD LOSS:  100 mL.  INTRAOPERATIVE FLUID:  2 L.  URINE OUTPUT:  200 mL urine.  COMPLICATIONS:  None.  DESCRIPTION OF PROCEDURE:  Under adequate general anesthesia, the patient was positioned in the dorsal lithotomy position.  The patient was confirmed by Dr. Vikki Ports.  Examination under anesthesia revealed a small uterus anteverted, no adnexal masses could be appreciated.  The cervix was at the level of the introitus.  The patient was sterilely prepped and draped in usual fashion.  An indwelling Foley catheter was sterilely placed.  Weighted speculum was placed in the vagina.  Sims retractor was placed anteriorly.  The cervix was grasped with Yates Decamp clamps and the cervical vaginal junction was identified.  Dilute solution of 1% lidocaine with 1:200,000 epinephrine was injected at the cervical vaginal junction.  Circumferential incision was then made at that junction and with sharp dissection, the posterior cul-de-sac was subsequently opened and the incision extended transversely.  The vaginal cuff, at that point, was oversewn with running lock stitch of 0 Vicryl suture.  The uterosacral ligaments was bilaterally clamped, cut, and suture ligated with 0 Vicryl suture.  Attention was then turned anteriorly where sharp dissection was  performed.  The patient had a prior history of cesarean section.  An anticipated scar tissue was taken into consideration as the dissection was done.  The dissection was carefully continued until the anterior cul-de-sac was reached and opened transversely.  Once this was done, Sims retractor was then replaced with displacing the bladder anteriorly.  At that point, using the LigaSure, the cardinal ligaments bilaterally were clamped, cauterized, and then cut, followed by the uterine vessels bilaterally clamped, cauterized, and then cut, carried up to the utero-ovarian ligaments bilaterally which were serially clamped, cauterized, and then ultimately cut.  Prior to cutting the left side of the utero-ovarian ligament, attempt grasping the left fallopian tube, the proximal portion was able to grasp, but the distal portion could not be seen, nor could ovary be noted or palpated or seen on that left side.  On the opposite side, once the left utero- ovarian ligament had been separated, the uterus now free.  Attempted to identify again the ovary on the right as well as the tube, the proximal portion being seen and distal could not be seen.  Both adnexa appeared to be adherent to the lateral walls.  The decision was then made to proceed with removal of the uterus by detaching it from the right utero- ovarian complex using serial clamping, cauterization using LigaSure with a small uterus then subsequently being removed.  Despite use of bowel packing retractions and  a sponge on a stick to identify the ovary and/or tube as well as bimanual digital palpation was unsuccessful in identifying those structures.  The procedure was therefore continued without removal of the planned bilateral tubes.  Good hemostasis was achieved.  At that point, the procedure was turned over to Dr. Matilde Sprang, please see his dictated operative report.  Cystoscopy was done by Dr Matilde Sprang shortly after my procedure with good  evidence of ureteral jets bilaterally.  He performed cystoscopy, cystocele repair, vault suspension with graft and once that was done, I then did a McCall culdoplasty and closed the posterior cul-de-sac using 0 Vicryl from the uterosacral ligaments bilaterally and then closing the vaginal cuff in the midline with interrupted 0 Vicryl sutures and tying the uterosacrals in the midline prior to closing the vaginal cuff completely. The procedure was then turned back over to Dr. Matilde Sprang where he performed a rectocele repair.  Once that was done, the vagina was irrigated and suctioned.  Good hemostasis noted.  The vagina was then packed with estrogen vaginal cream soaked gauze, and a Foley had been reinserted sterilely.   Specimens: uterus with cervix sent to Pathology.  Sponge and instrument counts x2 was correct.  The patient tolerated the procedure well, was transferred to recovery room in stable condition.     Servando Salina, M.D.     [place]/MEDQ  D:  07/12/2013  T:  07/13/2013  Job:  [id number]

## 2013-07-15 ENCOUNTER — Emergency Department (HOSPITAL_COMMUNITY): Payer: Federal, State, Local not specified - PPO

## 2013-07-15 ENCOUNTER — Emergency Department (HOSPITAL_COMMUNITY)
Admission: EM | Admit: 2013-07-15 | Discharge: 2013-07-15 | Disposition: A | Discharge status: Home / Self Care | Payer: Federal, State, Local not specified - PPO | Attending: Emergency Medicine | Admitting: Emergency Medicine

## 2013-07-15 ENCOUNTER — Encounter (HOSPITAL_COMMUNITY): Payer: Self-pay | Admitting: Emergency Medicine

## 2013-07-15 DIAGNOSIS — Z8669 Personal history of other diseases of the nervous system and sense organs: Secondary | ICD-10-CM | POA: Insufficient documentation

## 2013-07-15 DIAGNOSIS — G8918 Other acute postprocedural pain: Secondary | ICD-10-CM | POA: Insufficient documentation

## 2013-07-15 DIAGNOSIS — Z90710 Acquired absence of both cervix and uterus: Secondary | ICD-10-CM | POA: Insufficient documentation

## 2013-07-15 DIAGNOSIS — Z9889 Other specified postprocedural states: Secondary | ICD-10-CM | POA: Insufficient documentation

## 2013-07-15 DIAGNOSIS — I10 Essential (primary) hypertension: Secondary | ICD-10-CM | POA: Insufficient documentation

## 2013-07-15 DIAGNOSIS — F3289 Other specified depressive episodes: Secondary | ICD-10-CM | POA: Insufficient documentation

## 2013-07-15 DIAGNOSIS — F329 Major depressive disorder, single episode, unspecified: Secondary | ICD-10-CM | POA: Insufficient documentation

## 2013-07-15 DIAGNOSIS — Z79899 Other long term (current) drug therapy: Secondary | ICD-10-CM | POA: Insufficient documentation

## 2013-07-15 DIAGNOSIS — E039 Hypothyroidism, unspecified: Secondary | ICD-10-CM | POA: Insufficient documentation

## 2013-07-15 LAB — CBC WITH DIFFERENTIAL/PLATELET
Basophils Absolute: 0 10*3/uL (ref 0.0–0.1)
Basophils Relative: 0 % (ref 0–1)
Eosinophils Absolute: 0.2 10*3/uL (ref 0.0–0.7)
Eosinophils Relative: 2 % (ref 0–5)
HCT: 37.4 % (ref 36.0–46.0)
Hemoglobin: 12.2 g/dL (ref 12.0–15.0)
Lymphocytes Relative: 15 % (ref 12–46)
Lymphs Abs: 1.7 10*3/uL (ref 0.7–4.0)
MCH: 30.4 pg (ref 26.0–34.0)
MCHC: 32.6 g/dL (ref 30.0–36.0)
MCV: 93.3 fL (ref 78.0–100.0)
Monocytes Absolute: 0.9 10*3/uL (ref 0.1–1.0)
Monocytes Relative: 8 % (ref 3–12)
Neutro Abs: 8.6 10*3/uL — ABNORMAL HIGH (ref 1.7–7.7)
Neutrophils Relative %: 75 % (ref 43–77)
Platelets: 351 10*3/uL (ref 150–400)
RBC: 4.01 MIL/uL (ref 3.87–5.11)
RDW: 13.6 % (ref 11.5–15.5)
WBC: 11.5 10*3/uL — ABNORMAL HIGH (ref 4.0–10.5)

## 2013-07-15 LAB — COMPREHENSIVE METABOLIC PANEL
ALT: 14 U/L (ref 0–35)
AST: 17 U/L (ref 0–37)
Albumin: 3.1 g/dL — ABNORMAL LOW (ref 3.5–5.2)
Alkaline Phosphatase: 86 U/L (ref 39–117)
BUN: 10 mg/dL (ref 6–23)
CALCIUM: 9.2 mg/dL (ref 8.4–10.5)
CO2: 28 mEq/L (ref 19–32)
Chloride: 94 mEq/L — ABNORMAL LOW (ref 96–112)
Creatinine, Ser: 0.64 mg/dL (ref 0.50–1.10)
GFR calc non Af Amer: 90 mL/min — ABNORMAL LOW (ref >90)
GLUCOSE: 102 mg/dL — AB (ref 70–99)
Potassium: 3.6 mEq/L — ABNORMAL LOW (ref 3.7–5.3)
Sodium: 135 mEq/L — ABNORMAL LOW (ref 137–147)
TOTAL PROTEIN: 6.8 g/dL (ref 6.0–8.3)
Total Bilirubin: 0.4 mg/dL (ref 0.3–1.2)

## 2013-07-15 MED ORDER — DOCUSATE SODIUM 100 MG PO CAPS: 100.0000 mg | ORAL_CAPSULE | Freq: Two times a day (BID) | ORAL | Status: AC

## 2013-07-15 MED ORDER — IOHEXOL 300 MG/ML  SOLN
100.0000 mL | Freq: Once | INTRAMUSCULAR | Status: AC | PRN
  Administered 2013-07-15: 100 mL via INTRAVENOUS

## 2013-07-15 MED ORDER — HYDROCODONE-ACETAMINOPHEN 5-325 MG PO TABS: 1.0000 | ORAL_TABLET | Freq: Four times a day (QID) | ORAL | Status: AC | PRN

## 2013-07-15 MED ORDER — DIPHENHYDRAMINE HCL 50 MG/ML IJ SOLN
25.0000 mg | Freq: Once | INTRAMUSCULAR | Status: AC
  Administered 2013-07-15: 25 mg via INTRAVENOUS
  Filled 2013-07-15: qty 1

## 2013-07-15 MED ORDER — IOHEXOL 300 MG/ML  SOLN
50.0000 mL | Freq: Once | INTRAMUSCULAR | Status: AC | PRN
  Administered 2013-07-15: 50 mL via ORAL

## 2013-07-15 NOTE — ED Notes (Signed)
Pt returned from CT °

## 2013-07-15 NOTE — ED Notes (Signed)
Pt c/o swelling to her top lip and thinks that it may be the oxycodone that she has been taking. Pt had swelling prior to drinking contrast. Notified Dr. Venora Maples

## 2013-07-15 NOTE — ED Notes (Signed)
Pt in CT.

## 2013-07-15 NOTE — ED Provider Notes (Signed)
CSN: [id number]     Arrival date & time 07/15/13  1240 History   First MD Initiated Contact with Patient 07/15/13 1303     Chief Complaint  Patient presents with  . Abdominal Pain      HPI Patient reports a recent hysterectomy/anterior cystocele repair/posterior rectus-year-old para/cystoscopy/vaginal vault suspension with graft on 07/12/2013.  She now presents with worsening lower abdominal pain.  No nausea vomiting or diarrhea.  She has not moved her bowels since the surgery.  She denies fevers and chills.  She has a catheter in place.  She continues to have normal output out of her Foley catheter.  Her pain is more localized to her lower abdomen and slightly worse on the right.  No altered mental status or confusion.  No chest pain or shortness of breath.   Past Medical History  Diagnosis Date  . Hypertension   . Hypothyroidism   . Depression   . Neuropathy    Past Surgical History  Procedure Laterality Date  . Cesarean section    . Bunionectomy Right   . Vaginal hysterectomy N/A 07/12/2013    Procedure: HYSTERECTOMY VAGINAL ;  Surgeon: Marvene Staff, MD;  Location: [facility];  Service: Gynecology;  Laterality: N/A;  . Anterior and posterior repair N/A 07/12/2013    Procedure: ANTERIOR (CYSTOCELE) AND POSTERIOR REPAIR (RECTOCELE);  Surgeon: Reece Packer, MD;  Location: [facility];  Service: Urology;  Laterality: N/A;  . Cysto N/A 07/12/2013    Procedure: Kathrene Alu;  Surgeon: Reece Packer, MD;  Location: [facility];  Service: Urology;  Laterality: N/A;  . Vaginal prolapse repair N/A 07/12/2013    Procedure: VAGINAL VAULT SUSPENSION WITH GRAFT;  Surgeon: Reece Packer, MD;  Location: [facility];  Service: Urology;  Laterality: N/A;   No family history on file. History  Substance Use Topics  . Smoking status: Never Smoker   . Smokeless tobacco: Not on file  . Alcohol Use: Yes     Comment: rarely beer or wine   OB History   Grav Para Term Preterm Abortions TAB SAB Ect Mult  Living                 Review of Systems  All other systems reviewed and are negative.     Allergies  Lactose intolerance (gi)  Home Medications   Prior to Admission medications   Medication Sig Start Date End Date Taking? Authorizing Provider  amLODipine (NORVASC) 10 MG tablet Take 10 mg by mouth daily.    Historical Provider, MD  buPROPion (WELLBUTRIN XL) 150 MG 24 hr tablet Take 150 mg by mouth daily.    Historical Provider, MD  enalapril (VASOTEC) 5 MG tablet Take 5 mg by mouth daily.    Historical Provider, MD  FLUoxetine (PROZAC) 40 MG capsule Take 80 mg by mouth daily.    Historical Provider, MD  gabapentin (NEURONTIN) 300 MG capsule Take 300 mg by mouth at bedtime.    Historical Provider, MD  Homeopathic Products (ARNICARE ARNICA) CREA Apply 1 application topically daily as needed (rhematic pain).    Historical Provider, MD  ibuprofen (ADVIL,MOTRIN) 800 MG tablet Take 1 tablet (800 mg total) by mouth every 8 (eight) hours as needed (mild pain). 07/13/13   Sheronette Clint Bolder, MD  levothyroxine (SYNTHROID, LEVOTHROID) 137 MCG tablet Take 137 mcg by mouth daily before breakfast.    Historical Provider, MD  loperamide (IMODIUM) 2 MG capsule Take 2-4 mg by mouth as needed for diarrhea or loose  stools.    Historical Provider, MD  loratadine (CLARITIN) 10 MG tablet Take 10 mg by mouth daily as needed for allergies.    Historical Provider, MD  methylphenidate 27 MG PO CR tablet Take 27 mg by mouth every morning. Prescribed by Dr. Toy Care    Historical Provider, MD  omega-3 acid ethyl esters (LOVAZA) 1 G capsule Take 1 g by mouth 2 (two) times daily. Melaleuca Brand    Historical Provider, MD  oxyCODONE-acetaminophen (PERCOCET/ROXICET) 5-325 MG per tablet Take 1-2 tablets by mouth every 4 (four) hours as needed for severe pain (moderate to severe pain (when tolerating fluids)). 07/13/13   Sheronette Clint Bolder, MD  pramoxine-zinc acetate (CALADRYL CLEAR) 1-0.1 % LOTN Apply 1 application  topically as needed.    Historical Provider, MD  triamterene-hydrochlorothiazide (DYAZIDE) 37.5-25 MG per capsule Take 1 capsule by mouth daily.    Historical Provider, MD   BP 104/83  Pulse 65  Temp(Src) 98 F (36.7 C) (Oral)  Resp 22  Ht 5\' 6"  (1.676 m)  Wt 200 lb (90.719 kg)  BMI 32.30 kg/m2  SpO2 98% Physical Exam  Nursing note and vitals reviewed. Constitutional: She is oriented to person, place, and time. She appears well-developed and well-nourished. No distress.  HENT:  Head: Normocephalic and atraumatic.  Eyes: EOM are normal.  Neck: Normal range of motion.  Cardiovascular: Normal rate, regular rhythm and normal heart sounds.   Pulmonary/Chest: Effort normal and breath sounds normal.  Mild lower abdominal tenderness without guarding or rebound  Abdominal: Soft. She exhibits no distension.  Musculoskeletal: Normal range of motion.  Neurological: She is alert and oriented to person, place, and time.  Skin: Skin is warm and dry.  Psychiatric: She has a normal mood and affect. Judgment normal.    ED Course  Procedures (including critical care time) Labs Review Labs Reviewed  CBC WITH DIFFERENTIAL - Abnormal; Notable for the following:    WBC 11.5 (*)    Neutro Abs 8.6 (*)    All other components within normal limits  COMPREHENSIVE METABOLIC PANEL - Abnormal; Notable for the following:    Sodium 135 (*)    Potassium 3.6 (*)    Chloride 94 (*)    Glucose, Bld 102 (*)    Albumin 3.1 (*)    GFR calc non Af Amer 90 (*)    All other components within normal limits    Imaging Review No results found.   EKG Interpretation None      MDM   Final diagnoses:  None   Care to Dr Mingo Amber to followup on CT scan.    Hoy Morn, MD 07/15/13 [phone]

## 2013-07-15 NOTE — ED Notes (Signed)
Pt reports abd pain since hysterectomy 3 days ago at [facility]. Was having "normal" pain controlled with PO meds but this am approx 0800, pain became severe and meds have not helped. Has called Surgery and Gyn with no call backs. Has foley that was left in at discharge from hospital due to urinary retention. Severe lower abd pain radiating to lower back.

## 2013-07-15 NOTE — ED Notes (Signed)
Pt reports vaginal hysterectomy 3 days ago at [facility]. Pt has increased pain today that is not controlled with pain meds. Pt states taking 2 oxycodone at 0830 this am with no relief. Pt states that she is spotting that same as she was after surgery. Pt foley that she arrived with has 400 cc urine and husband reports emptying after being half full this am. Pt adds that she has not had BM in 4 days, but has "not been eating very much." Pt is A&O and in NAD. Pt bilateral lung sounds clear.

## 2013-07-15 NOTE — ED Provider Notes (Signed)
Alexis Lang from Dr. Venora Maples. 42F with recent pelvic surgery 3 days ago, here with pain. No fevers or vomiting. Hasn't had a bowel movement since surgery. Pain meds not helping - taking oxycodone. Awaiting CT 1620 - CT abd/pelvis with no contrast extravasation, no abscess. Normal post-surgical changes. GB distention with gallstone in neck of GB. No evidence of cholecystitis on scan. Patient has no RUQ tenderness on repeat exam. LFTs normal. Will give hydrocodone Rx to go home and stool softener. Stable for discharge.  1. Post-operative pain      Alexis Shipper, MD 07/15/13 [phone]

## 2015-01-31 IMAGING — CT CT ABD-PELV W/ CM
1 of 4 series · 13 of 32 positions shown, 18 images · IV contrast (OMNIPAQUE 300)
Comparison: None.

CLINICAL DATA: Urinary retention, vaginal hysterectomy

EXAM:
CT ABDOMEN AND PELVIS WITH CONTRAST
TECHNIQUE: Multidetector CT imaging of the abdomen and pelvis was performed
using the standard protocol following bolus administration of
intravenous contrast.
CONTRAST:  50mL OMNIPAQUE IOHEXOL 300 MG/ML SOLN, 100mL OMNIPAQUE
IOHEXOL 300 MG/ML SOLN

[Series 2: abd/pel with · axial · 0.65mm/px · z∈[-438,-48]mm · 13 of 90 slices shown, 18 images]
[im 6/90  soft-tissue]
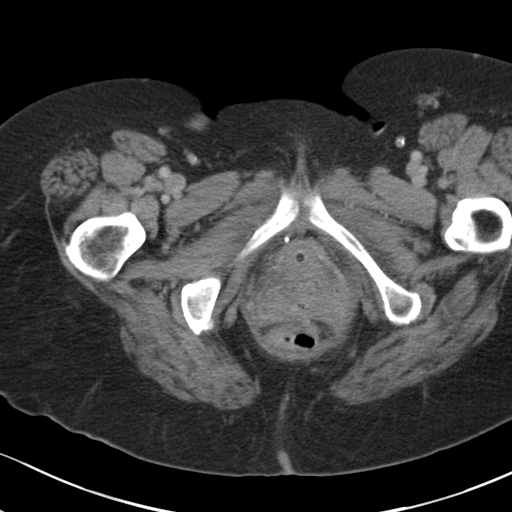
[im 6/90  bone]
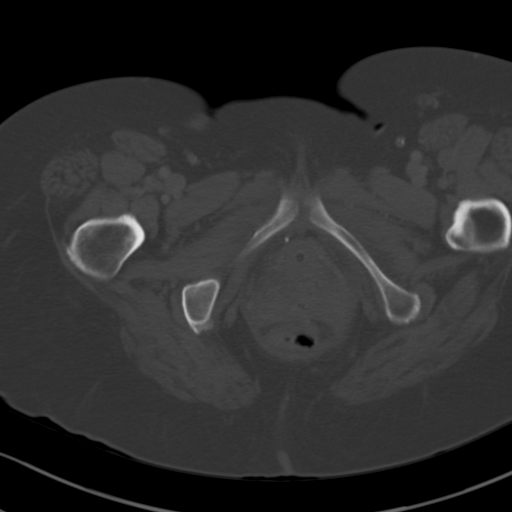
[im 12/90  soft-tissue]
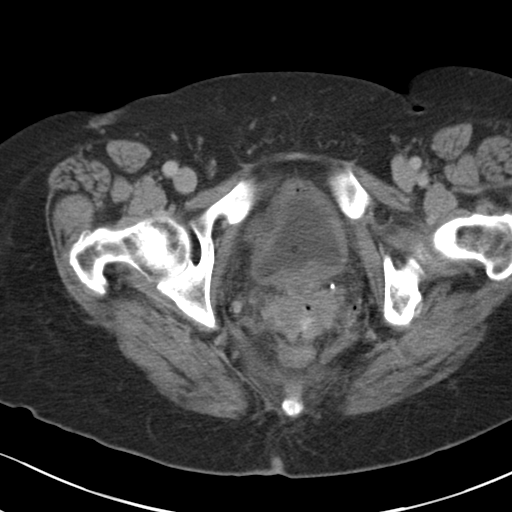
[im 23/90  soft-tissue]
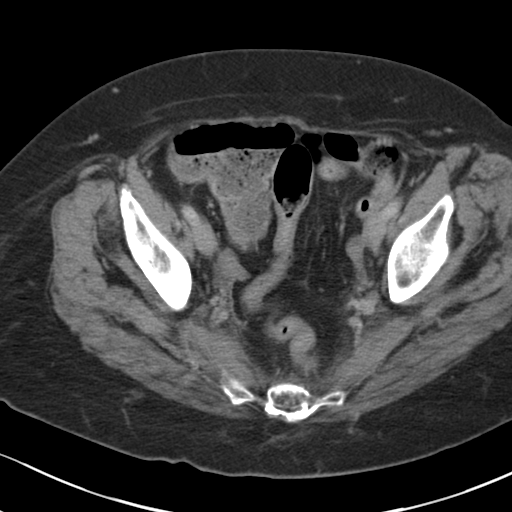
[im 28/90  soft-tissue]
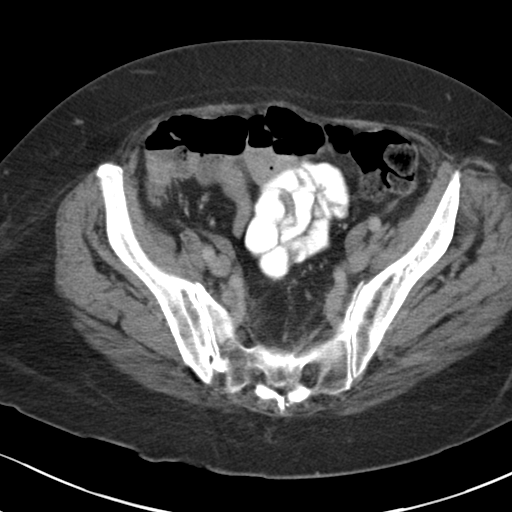
[im 34/90  soft-tissue]
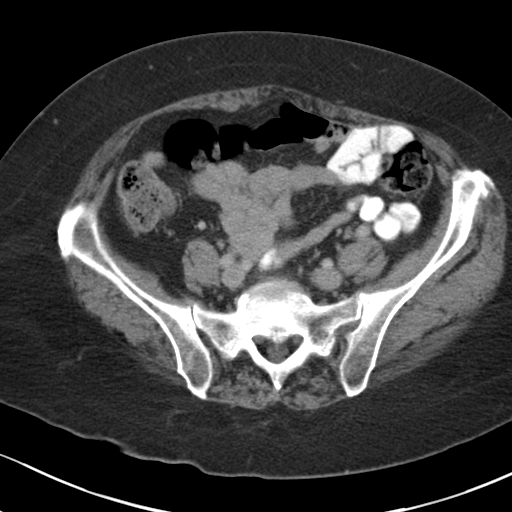
[im 39/90  soft-tissue]
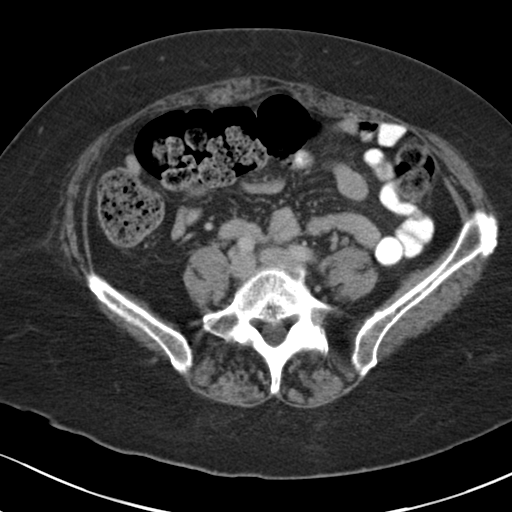
[im 51/90  soft-tissue]
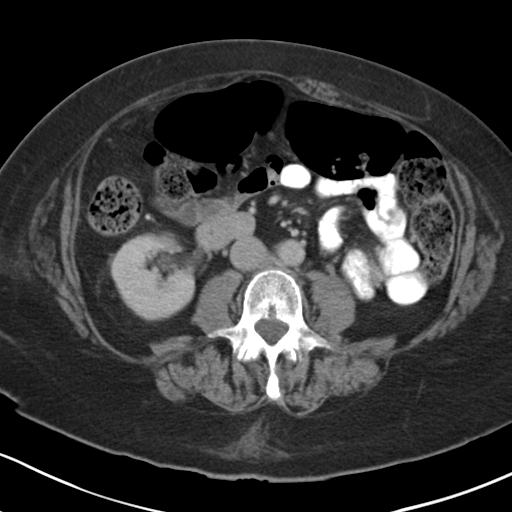
[im 56/90  soft-tissue]
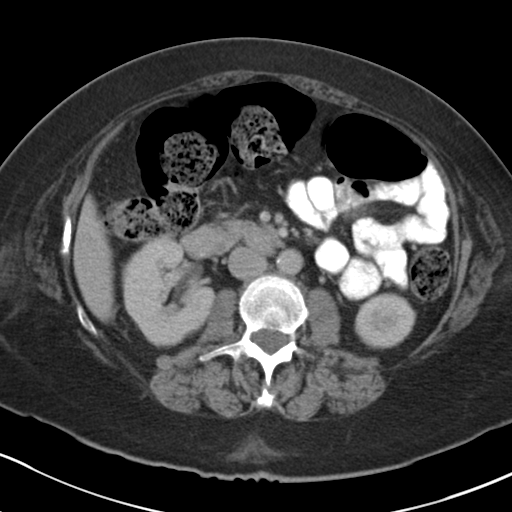
[im 62/90  soft-tissue]
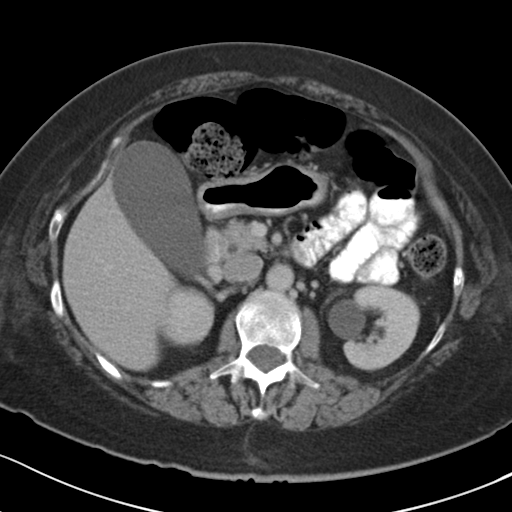
[im 62/90  bone]
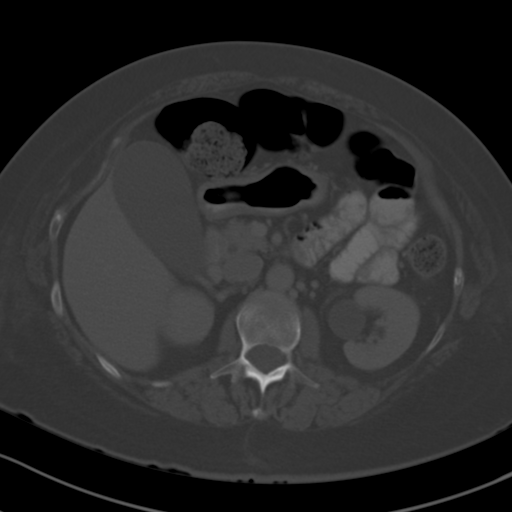
[im 67/90  soft-tissue]
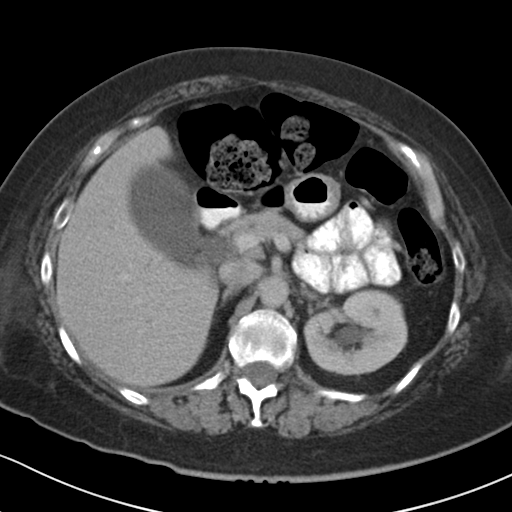
[im 67/90  lung]
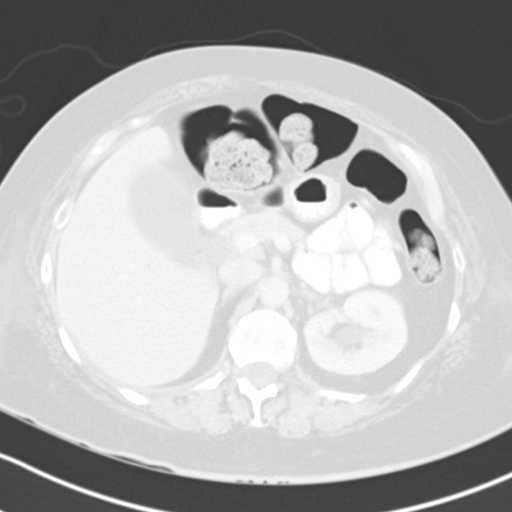
[im 73/90  lung]
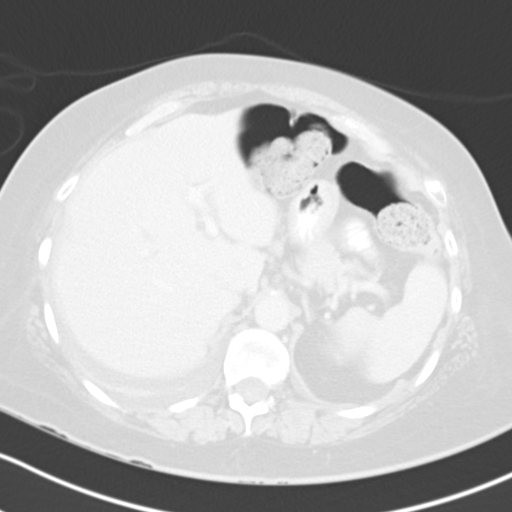
[im 78/90  soft-tissue]
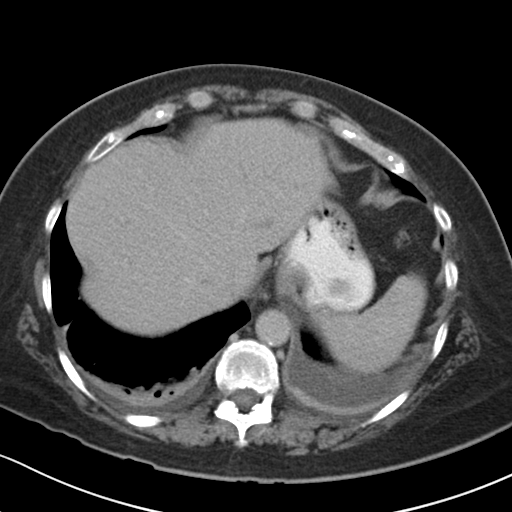
[im 78/90  lung]
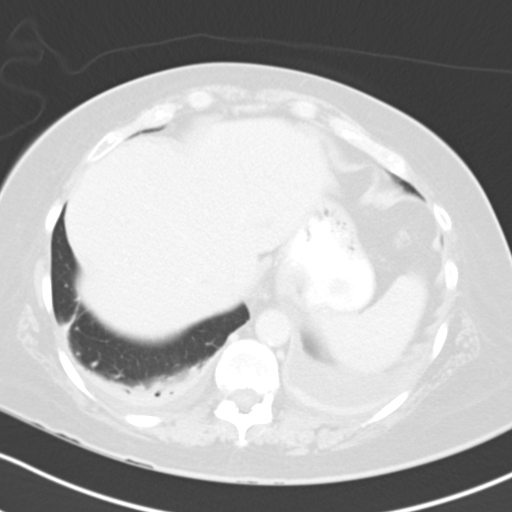
[im 84/90  soft-tissue]
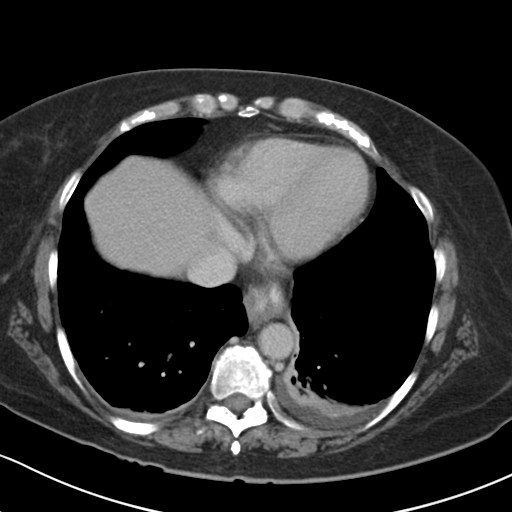
[im 84/90  lung]
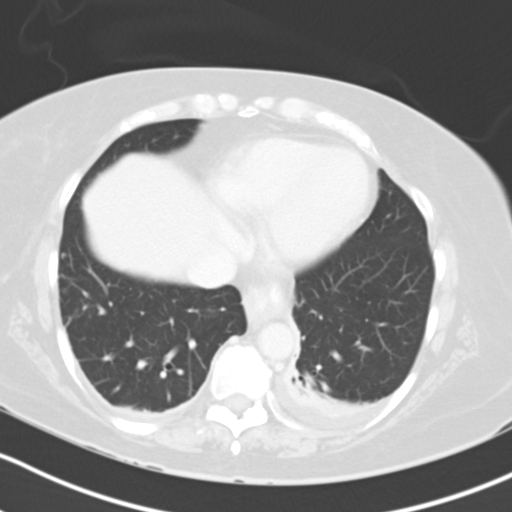

[13 of 32 positions shown; findings below may reference images not displayed]

FINDINGS: Small bilateral pleural effusions with compressive atelectasis.

The liver demonstrates no focal abnormality. There is no
intrahepatic or extrahepatic biliary ductal dilatation. The
gallbladder is distended without pericholecystic inflammatory
changes. There is a gallstone in the gallbladder neck.. The spleen
demonstrates no focal abnormality. The kidneys, adrenal glands and
pancreas are normal. The bladder is relatively decompressed with a
Foley catheter present. There is evidence of postsurgical changes
from recent vaginal hysterectomy. There is no focal fluid
collection. There are postoperative changes in the pelvis along the
pelvic sidewalls adjacent to the distal ureters bilaterally without
evidence of ureteral injury. There is no contrast extravasation from
within the ureters.

The stomach, duodenum, small intestine, and large intestine
demonstrate no contrast extravasation or dilatation. There is no
pneumoperitoneum, pneumatosis, or portal venous gas. There is no
abdominal or pelvic free fluid. There is no lymphadenopathy.

The abdominal aorta is normal in caliber with atherosclerosis.

There are no lytic or sclerotic osseous lesions. Mild broad-based
disc bulge at L5-S1.
IMPRESSION: 1. There is evidence of postsurgical changes from recent vaginal
hysterectomy. There is no focal fluid collection. There are
postoperative changes in the pelvis along the pelvic sidewalls
adjacent to the distal ureters bilaterally without evidence of
ureteral injury. There is no contrast extravasation from within the
ureters.
2. Bilateral small pleural effusions.
3. Gallbladder distention with a gallstone in the gallbladder neck.

## 2020-07-09 ENCOUNTER — Other Ambulatory Visit: Payer: Self-pay

## 2020-07-09 ENCOUNTER — Ambulatory Visit: Payer: Medicare Other | Admitting: Podiatry

## 2020-07-09 ENCOUNTER — Ambulatory Visit (INDEPENDENT_AMBULATORY_CARE_PROVIDER_SITE_OTHER): Payer: Medicare Other | Admitting: Podiatry

## 2020-07-09 ENCOUNTER — Other Ambulatory Visit: Payer: Self-pay | Admitting: Podiatry

## 2020-07-09 ENCOUNTER — Ambulatory Visit (INDEPENDENT_AMBULATORY_CARE_PROVIDER_SITE_OTHER): Payer: Medicare Other

## 2020-07-09 DIAGNOSIS — L84 Corns and callosities: Secondary | ICD-10-CM

## 2020-07-09 DIAGNOSIS — M205X1 Other deformities of toe(s) (acquired), right foot: Secondary | ICD-10-CM | POA: Diagnosis not present

## 2020-07-09 DIAGNOSIS — M79671 Pain in right foot: Secondary | ICD-10-CM

## 2020-07-09 DIAGNOSIS — M2011 Hallux valgus (acquired), right foot: Secondary | ICD-10-CM

## 2020-07-09 DIAGNOSIS — M79672 Pain in left foot: Secondary | ICD-10-CM | POA: Diagnosis not present

## 2020-07-09 DIAGNOSIS — M2041 Other hammer toe(s) (acquired), right foot: Secondary | ICD-10-CM

## 2020-07-09 DIAGNOSIS — G629 Polyneuropathy, unspecified: Secondary | ICD-10-CM

## 2020-07-09 NOTE — Progress Notes (Signed)
  Subjective:  Patient ID: Alexis Lang, female    DOB: 1946-02-10,  MRN: [id number]  Chief Complaint  Patient presents with  . Foot Problem    B/L numbness/neuropathy. Possible right toe infection in great and second toe. Hx of bunions and hammertoes.    75 y.o. female presents with the above complaint. History confirmed with patient.   Objective:  Physical Exam: warm, good capillary refill, no trophic changes or ulcerative lesions, normal DP and PT pulses and normal sensory exam.  Right Foot: Hammertoes lesser digits with 2nd toe medial DIPJ and PIPJ corns. 3rd toe DIPJ corn. Severe HAV right  No images are attached to the encounter.  Radiographs: X-ray of the right foot: HAV deformity with retained screws 1st metatarsal. Fused hammertoes 2nd/3rd toe.  Assessment:   1. Hallux valgus, right   2. Hammer toe of right foot   3. Corns and callosities   4. Adductovarus rotation of toe, acquired, right    Plan:  Patient was evaluated and treated and all questions answered.  Hammertoe -XR reviewed with patient -Educated on etiology of deformity -Discussed padding and shoe gear changes -Dispensed toe spacers -Discussed with patient that they would benefit from surgical intervention after having failed all conservative therapy.  Return in about 1 month (around 08/09/2020) for Hammertoe .

## 2020-08-13 ENCOUNTER — Ambulatory Visit (INDEPENDENT_AMBULATORY_CARE_PROVIDER_SITE_OTHER): Payer: Medicare Other | Admitting: Podiatry

## 2020-08-13 ENCOUNTER — Other Ambulatory Visit: Payer: Self-pay

## 2020-08-13 DIAGNOSIS — M205X1 Other deformities of toe(s) (acquired), right foot: Secondary | ICD-10-CM | POA: Diagnosis not present

## 2020-08-13 DIAGNOSIS — M2011 Hallux valgus (acquired), right foot: Secondary | ICD-10-CM | POA: Diagnosis not present

## 2020-08-13 DIAGNOSIS — M2041 Other hammer toe(s) (acquired), right foot: Secondary | ICD-10-CM | POA: Diagnosis not present

## 2020-08-16 NOTE — Progress Notes (Signed)
  Subjective:  Patient ID: Alexis Lang, female    DOB: 25-Nov-1945,  MRN: [id number]  Chief Complaint  Patient presents with   Follow-up    Using tissue for spacer in btwn hallux and second digit for relief.    75 y.o. female presents with the above complaint. History confirmed with patient.   Objective:  Physical Exam: warm, good capillary refill, no trophic changes or ulcerative lesions, normal DP and PT pulses and normal sensory exam.  Right Foot: Hammertoes lesser digits with 2nd toe medial DIPJ and PIPJ corns. 3rd toe DIPJ corn. Severe HAV right  No images are attached to the encounter.  Radiographs: 07/09/20 X-ray of the right foot: HAV deformity with retained screws 1st metatarsal. Fused hammertoes 2nd/3rd toe.  Assessment:   1. Hallux valgus, right   2. Hammer toe of right foot   3. Adductovarus rotation of toe, acquired, right     Plan:  Patient was evaluated and treated and all questions answered.  Hammertoe -Given persistent pain this point I think surgical intervention is indicated -Patient has failed all conservative therapy and wishes to proceed with surgical intervention. All risks, benefits, and alternatives discussed with patient. No guarantees given. Consent reviewed and signed by patient. -Planned procedures: Right first MPJ replacement, second metatarsal shortening osteotomy, correction hammertoes 2 and 5 right foot  -DME dispensed for post-op use: [facility]    No follow-ups on file.

## 2020-09-18 ENCOUNTER — Other Ambulatory Visit: Payer: Self-pay | Admitting: Podiatry

## 2020-09-18 DIAGNOSIS — Z4889 Encounter for other specified surgical aftercare: Secondary | ICD-10-CM

## 2020-09-18 DIAGNOSIS — M21541 Acquired clubfoot, right foot: Secondary | ICD-10-CM

## 2020-09-18 DIAGNOSIS — M2041 Other hammer toe(s) (acquired), right foot: Secondary | ICD-10-CM

## 2020-09-18 DIAGNOSIS — M2011 Hallux valgus (acquired), right foot: Secondary | ICD-10-CM

## 2020-09-18 MED ORDER — HYDROCODONE-ACETAMINOPHEN 5-325 MG PO TABS: 1.0000 | ORAL_TABLET | Freq: Four times a day (QID) | ORAL | 0 refills | Status: DC | PRN

## 2020-09-18 MED ORDER — CEPHALEXIN 500 MG PO CAPS: ORAL_CAPSULE | ORAL | 0 refills | Status: AC

## 2020-09-20 ENCOUNTER — Encounter: Payer: Self-pay | Admitting: Podiatry

## 2020-09-20 NOTE — Progress Notes (Signed)
DOS: 09/18/20 Procedure: Alexis Lang bunion Implant Rt  Metatarsal osteotomy 2nd Rt Hammer toe repair Rt 2nd, 5th

## 2020-09-24 ENCOUNTER — Other Ambulatory Visit: Payer: Self-pay

## 2020-09-24 ENCOUNTER — Ambulatory Visit (INDEPENDENT_AMBULATORY_CARE_PROVIDER_SITE_OTHER): Payer: Medicare Other | Admitting: Podiatry

## 2020-09-24 ENCOUNTER — Ambulatory Visit (INDEPENDENT_AMBULATORY_CARE_PROVIDER_SITE_OTHER): Payer: Medicare Other

## 2020-09-24 DIAGNOSIS — Z9889 Other specified postprocedural states: Secondary | ICD-10-CM | POA: Diagnosis not present

## 2020-09-24 MED ORDER — HYDROCODONE-ACETAMINOPHEN 5-325 MG PO TABS: 1.0000 | ORAL_TABLET | Freq: Four times a day (QID) | ORAL | 0 refills | Status: AC | PRN

## 2020-09-24 NOTE — Progress Notes (Signed)
  Subjective:  Patient ID: Alexis Lang, female    DOB: May 26, 1945,  MRN: [id number]  Chief Complaint  Patient presents with   Routine Post Op    DOS 09/18/2020 RT FOOT 1ST MPJ REPLACEMENT, 2ND METATARSAL SHORTENING OSTEOTOMY, 2&5 HAMMERTOE CORRECTION   DOS: 09/18/20 Procedure: right 1st MPJ replacement, 2nd weil, 2nd/5th hammertoe correction   75 y.o. female presents with the above complaint. History confirmed with patient. Doing well post-op does find it difficult to walk in the boot. Pain controlled only taking 1 pain tablet daily.  Objective:  Physical Exam: tenderness at the surgical site, local edema noted, and calf supple, nontender. Incision: healing well, no significant drainage, no dehiscence, no significant erythema  No images are attached to the encounter.  Radiographs: X-ray of the right foot: consistent with post-op state, with good alignment and no evidence of hardware complication  Assessment:   1. Post-operative state    Plan:  Patient was evaluated and treated and all questions answered.  Post-operative State -XR reviewed with patient -Dressing applied consisting of antibiotic ointment, sterile gauze, kerlix, and ACE bandage -WBAT in Surgical shoe -Surgical shoe dispensed -Pain medication refilled -XRs needed at follow-up: none  Return in about 1 week (around 10/01/2020) for Post-Op (No XRs).

## 2020-10-01 ENCOUNTER — Ambulatory Visit (INDEPENDENT_AMBULATORY_CARE_PROVIDER_SITE_OTHER): Payer: Medicare Other | Admitting: Podiatry

## 2020-10-01 ENCOUNTER — Other Ambulatory Visit: Payer: Self-pay

## 2020-10-01 DIAGNOSIS — Z9889 Other specified postprocedural states: Secondary | ICD-10-CM

## 2020-10-01 DIAGNOSIS — M2011 Hallux valgus (acquired), right foot: Secondary | ICD-10-CM

## 2020-10-01 NOTE — Progress Notes (Signed)
  Subjective:  Patient ID: Alexis Lang, female    DOB: 03-01-1945,  MRN: [id number]  Chief Complaint  Patient presents with   Routine Post Op    POV #2 DOS 09/18/2020 RT FOOT 1ST MPJ REPLACEMENT, 2ND METATARSAL SHORTENING OSTEOTOMY, 2&5 HAMMERTOE CORRECTION   DOS: 09/18/20 Procedure: right 1st MPJ replacement, 2nd weil, 2nd/5th hammertoe correction   75 y.o. female presents with the above complaint. History confirmed with patient. Doing better this week pain continues to improve. Denies new complaints.  Objective:  Physical Exam: tenderness at the surgical site, local edema noted, and calf supple, nontender. Incision: healing well, no significant drainage, no dehiscence, no significant erythema   Assessment:   1. Post-operative state   2. Hallux valgus, right     Plan:  Patient was evaluated and treated and all questions answered.  Post-operative State -Dressing applied consisting of sterile gauze, kerlix, and ACE bandage -WBAT in Surgical shoe -Sutures not ready for removal today plan for removal next week. -XRs needed at follow-up: none  Return in about 1 week (around 10/08/2020) for Post-Op (No XRs) suture removal.

## 2020-10-08 ENCOUNTER — Ambulatory Visit (INDEPENDENT_AMBULATORY_CARE_PROVIDER_SITE_OTHER): Payer: Medicare Other | Admitting: Podiatry

## 2020-10-08 ENCOUNTER — Other Ambulatory Visit: Payer: Self-pay

## 2020-10-08 DIAGNOSIS — M2011 Hallux valgus (acquired), right foot: Secondary | ICD-10-CM

## 2020-10-08 DIAGNOSIS — Z9889 Other specified postprocedural states: Secondary | ICD-10-CM

## 2020-10-08 DIAGNOSIS — M2041 Other hammer toe(s) (acquired), right foot: Secondary | ICD-10-CM

## 2020-10-08 DIAGNOSIS — M205X1 Other deformities of toe(s) (acquired), right foot: Secondary | ICD-10-CM

## 2020-10-08 NOTE — Progress Notes (Signed)
  Subjective:  Patient ID: Alexis Lang, female    DOB: 08/25/1945,  MRN: [id number]  Chief Complaint  Patient presents with   Routine Post Op    PT stated that she is doing well    DOS: 09/18/20 Procedure: right 1st MPJ replacement, 2nd weil, 2nd/5th hammertoe correction   75 y.o. female presents with the above complaint. History confirmed with patient. Denies pain or other post-op issues, presents for suture removal. Objective:  Physical Exam: tenderness at the surgical site, local edema noted, and calf supple, nontender. Incision: healing well, no significant drainage, no dehiscence, no significant erythema   Assessment:   1. Post-operative state   2. Hallux valgus, right   3. Hammer toe of right foot   4. Adductovarus rotation of toe, acquired, right    Plan:  Patient was evaluated and treated and all questions answered.  Post-operative State -Sutures removed -Steri-strips applied to the incision -Ok to start showering at this time. Advised they cannot soak. -WBAT in Surgical shoe -XRs needed at follow-up: 3 view Foot  Return in about 2 weeks (around 10/22/2020) for Post-Op (with XRs).

## 2020-10-22 ENCOUNTER — Ambulatory Visit (INDEPENDENT_AMBULATORY_CARE_PROVIDER_SITE_OTHER): Payer: Medicare Other

## 2020-10-22 ENCOUNTER — Ambulatory Visit (INDEPENDENT_AMBULATORY_CARE_PROVIDER_SITE_OTHER): Payer: Medicare Other | Admitting: Podiatry

## 2020-10-22 ENCOUNTER — Other Ambulatory Visit: Payer: Self-pay

## 2020-10-22 DIAGNOSIS — Z9889 Other specified postprocedural states: Secondary | ICD-10-CM

## 2020-10-22 DIAGNOSIS — M2011 Hallux valgus (acquired), right foot: Secondary | ICD-10-CM

## 2020-10-22 NOTE — Progress Notes (Signed)
  Subjective:  Patient ID: Alexis Lang, female    DOB: 11-Jun-1945,  MRN: [id number]  Chief Complaint  Patient presents with   Routine Post Op     POV #3 DOS 09/18/2020 RT FOOT 1ST MPJ REPLACEMENT, 2ND METATARSAL SHORTENING OSTEOTOMY, 2&5 HAMMERTOE CORRECTION. PT states she has been doing well. Pt states he wants a referral to PT.    DOS: 09/18/20 Procedure: right 1st MPJ replacement, 2nd weil, 2nd/5th hammertoe correction   75 y.o. female presents with the above complaint. History confirmed with patient. Objective:  Physical Exam: no tenderness at the surgical site, local edema noted, and calf supple, nontender. 2nd MPJ slight DF deformity with reduced ROM, reducible on manual stretch. Good 1st MPJ ROM. Incision: well healed Assessment:   1. Post-operative state   2. Hallux valgus, right    Plan:  Patient was evaluated and treated and all questions answered.  Post-operative State -Has some 1st and 2nd MPJ capsule tightness. Would benefit from PT for stretching and strengthening. Referral placed -Continue surgical shoe for 2 weeks then slowly transition to normal shoegear. -XRs needed at follow-up: none  Return in about 1 month (around 11/21/2020) for Post-Op (No XRs).

## 2020-11-22 ENCOUNTER — Encounter: Payer: Medicare Other | Admitting: Podiatry

## 2020-12-13 ENCOUNTER — Ambulatory Visit (INDEPENDENT_AMBULATORY_CARE_PROVIDER_SITE_OTHER): Payer: Medicare Other | Admitting: Podiatry

## 2020-12-13 ENCOUNTER — Other Ambulatory Visit: Payer: Self-pay

## 2020-12-13 DIAGNOSIS — Z9889 Other specified postprocedural states: Secondary | ICD-10-CM

## 2020-12-13 DIAGNOSIS — M2011 Hallux valgus (acquired), right foot: Secondary | ICD-10-CM

## 2020-12-13 NOTE — Progress Notes (Signed)
  Subjective:  Patient ID: Alexis Lang, female    DOB: 1945-06-17,  MRN: [id number]  Chief Complaint  Patient presents with   Routine Post Op    Patient states her foot is feeling much better   DOS: 09/18/20 Procedure: right 1st MPJ replacement, 2nd weil, 2nd/5th hammertoe correction   75 y.o. female presents with the above complaint. History confirmed with patient. Objective:  Physical Exam: no tenderness at the surgical site, local edema noted, and calf supple, nontender. 2nd MPJ slight DF deformity with reduced ROM, reducible on manual stretch. Good 1st MPJ ROM. Incision: well healed Assessment:   1. Post-operative state   2. Hallux valgus, right    Plan:  Patient was evaluated and treated and all questions answered.  Post-operative State -Her ROM has improved. She has not done PT but at present I do not think it wholly necessary. She has no pain states she is pleased with the results of her surgery and is tolerating normal shoegear well without issues. F/u PRN should issues persist.  No follow-ups on file.
